# Patient Record
Sex: Female | Born: 1980 | Race: Black or African American | Hispanic: No | Marital: Married | Smoking: Never smoker
Health system: Southern US, Community
[De-identification: ages and names within clinical notes are randomized; demographics above are authoritative.]

## PROBLEM LIST (undated history)

## (undated) ENCOUNTER — Inpatient Hospital Stay (HOSPITAL_COMMUNITY): Payer: Self-pay

## (undated) DIAGNOSIS — K37 Unspecified appendicitis: Secondary | ICD-10-CM

## (undated) DIAGNOSIS — Z87448 Personal history of other diseases of urinary system: Secondary | ICD-10-CM

## (undated) DIAGNOSIS — M94261 Chondromalacia, right knee: Secondary | ICD-10-CM

## (undated) HISTORY — DX: Unspecified appendicitis: K37

---
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
## 1999-08-01 HISTORY — PX: APPENDECTOMY: SHX54

## 2001-11-29 ENCOUNTER — Emergency Department (HOSPITAL_COMMUNITY): Admission: EM | Admit: 2001-11-29 | Discharge: 2001-11-29 | Payer: Self-pay | Admitting: Emergency Medicine

## 2001-12-01 ENCOUNTER — Inpatient Hospital Stay (HOSPITAL_COMMUNITY): Admission: AD | Admit: 2001-12-01 | Discharge: 2001-12-01 | Payer: Self-pay | Admitting: Obstetrics and Gynecology

## 2002-12-09 ENCOUNTER — Inpatient Hospital Stay (HOSPITAL_COMMUNITY): Admission: AD | Admit: 2002-12-09 | Discharge: 2002-12-10 | Payer: Self-pay | Admitting: Obstetrics and Gynecology

## 2003-03-12 ENCOUNTER — Emergency Department (HOSPITAL_COMMUNITY): Admission: EM | Admit: 2003-03-12 | Discharge: 2003-03-12 | Payer: Self-pay | Admitting: Emergency Medicine

## 2003-09-13 ENCOUNTER — Inpatient Hospital Stay (HOSPITAL_COMMUNITY): Admission: AD | Admit: 2003-09-13 | Discharge: 2003-09-14 | Payer: Self-pay | Admitting: *Deleted

## 2004-02-14 ENCOUNTER — Inpatient Hospital Stay (HOSPITAL_COMMUNITY): Admission: AD | Admit: 2004-02-14 | Discharge: 2004-02-14 | Payer: Self-pay | Admitting: Obstetrics and Gynecology

## 2004-03-15 ENCOUNTER — Emergency Department (HOSPITAL_COMMUNITY): Admission: EM | Admit: 2004-03-15 | Discharge: 2004-03-16 | Payer: Self-pay | Admitting: Emergency Medicine

## 2004-09-12 ENCOUNTER — Inpatient Hospital Stay (HOSPITAL_COMMUNITY): Admission: AD | Admit: 2004-09-12 | Discharge: 2004-09-12 | Payer: Self-pay | Admitting: *Deleted

## 2005-04-16 ENCOUNTER — Inpatient Hospital Stay (HOSPITAL_COMMUNITY): Admission: AD | Admit: 2005-04-16 | Discharge: 2005-04-16 | Payer: Self-pay | Admitting: Obstetrics & Gynecology

## 2005-07-08 ENCOUNTER — Emergency Department (HOSPITAL_COMMUNITY): Admission: EM | Admit: 2005-07-08 | Discharge: 2005-07-08 | Payer: Self-pay | Admitting: Emergency Medicine

## 2007-12-24 ENCOUNTER — Encounter: Admission: RE | Admit: 2007-12-24 | Discharge: 2007-12-24 | Payer: Self-pay | Admitting: Obstetrics and Gynecology

## 2008-02-03 ENCOUNTER — Inpatient Hospital Stay (HOSPITAL_COMMUNITY): Admission: AD | Admit: 2008-02-03 | Discharge: 2008-02-03 | Payer: Self-pay | Admitting: Obstetrics and Gynecology

## 2008-02-04 ENCOUNTER — Inpatient Hospital Stay (HOSPITAL_COMMUNITY): Admission: AD | Admit: 2008-02-04 | Discharge: 2008-02-07 | Payer: Self-pay | Admitting: Obstetrics and Gynecology

## 2008-07-28 ENCOUNTER — Encounter: Admission: RE | Admit: 2008-07-28 | Discharge: 2008-07-28 | Payer: Self-pay | Admitting: Obstetrics and Gynecology

## 2008-07-28 IMAGING — CT CT ABDOMEN W/ CM
3 of 4 series · 14 of 32 positions shown, 19 images · IV contrast (30CC OMNI 350 & [ID] OMNI 300)
Comparison: Ultrasound of [DATE]

CLINICAL DATA: Ventral abdominal wall hernia or lipoma.  Superior
to umbilicus.  Nausea.  5 months postpartum.  Prior appendectomy.

CT ABDOMEN  WITH CONTRAST
TECHNIQUE: Multidetector CT imaging of the abdomen was performed
following the standard protocol following the bolus administration
of intravenous contrast.
Contrast: 100 ml [O8]

[Series 2: abdomen w/ · axial · 0.70mm/px · z∈[-219,-39]mm · 4 of 61 slices shown, 9 images]
[im 13/61  soft-tissue]
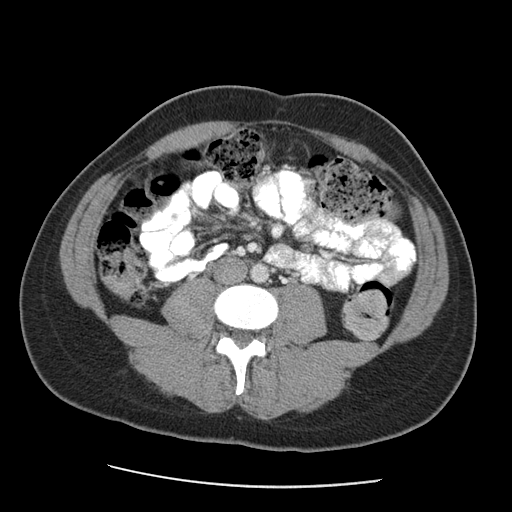
[im 13/61  lung]
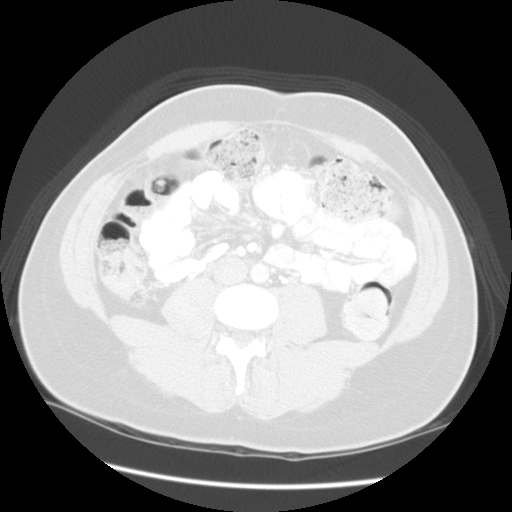
[im 13/61  bone]
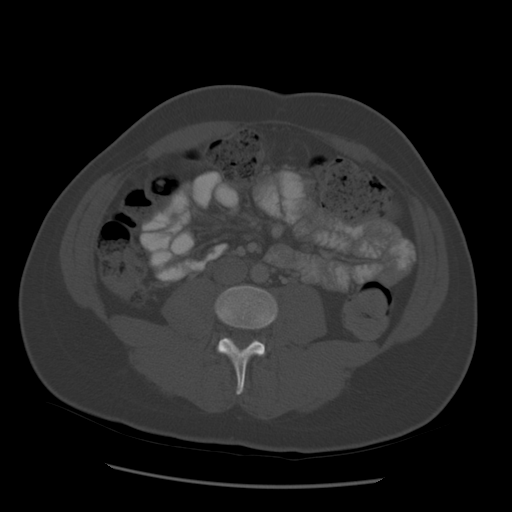
[im 25/61  soft-tissue]
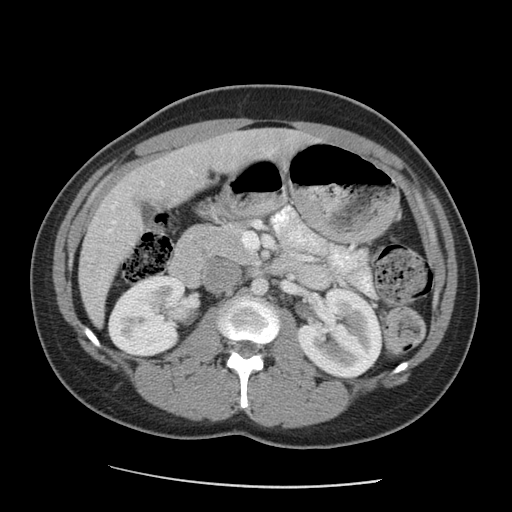
[im 25/61  lung]
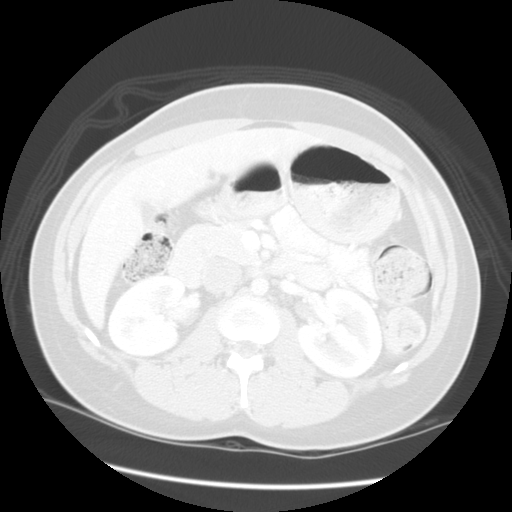
[im 37/61  soft-tissue]
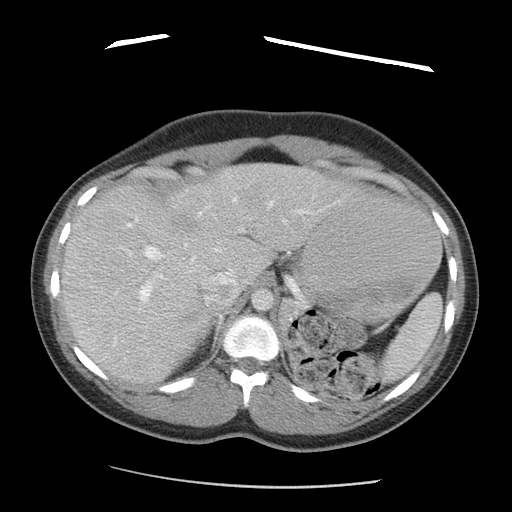
[im 37/61  lung]
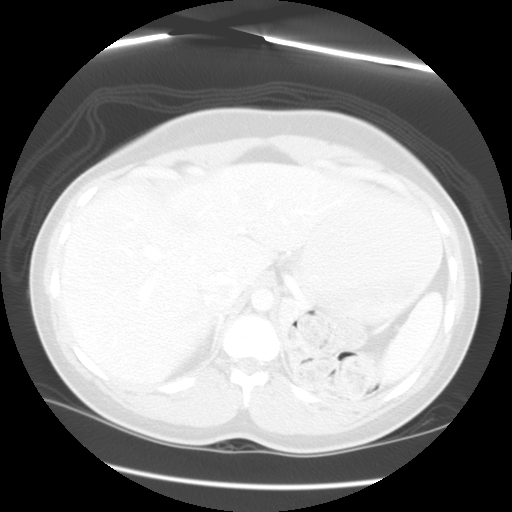
[im 49/61  soft-tissue]
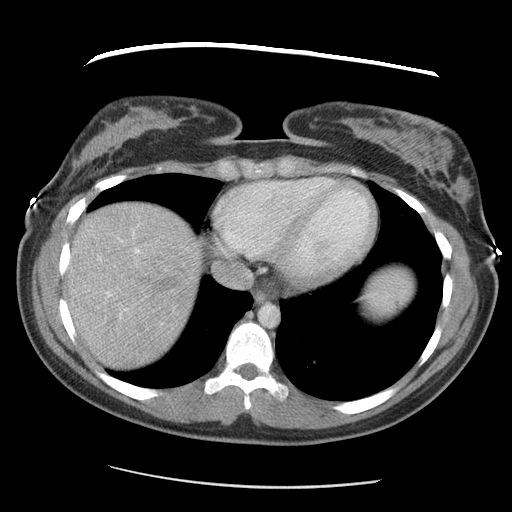
[im 49/61  lung]
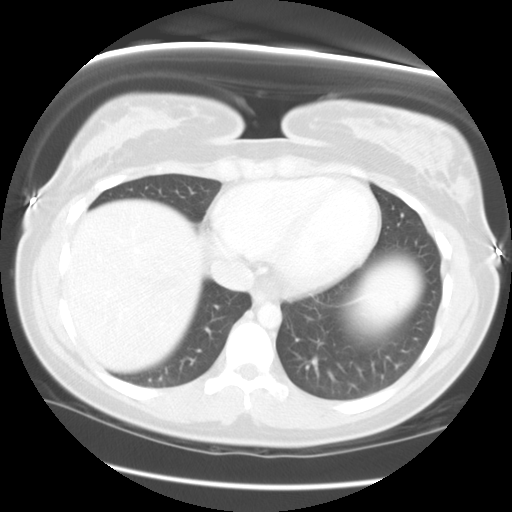

[Series 400: coronal · coronal · 0.70mm/px · 2 of 111 slices shown]
[im 13/111  soft-tissue]
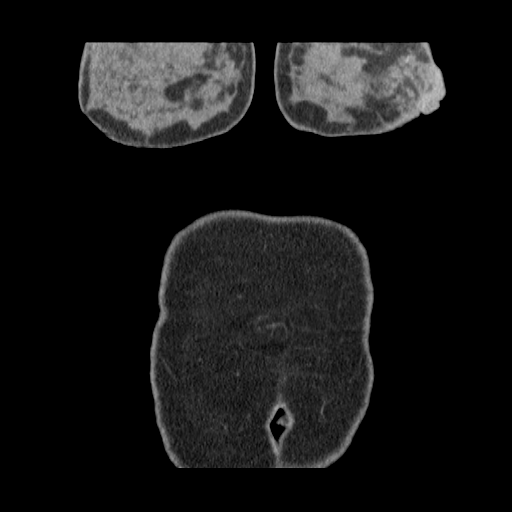
[im 25/111  soft-tissue]
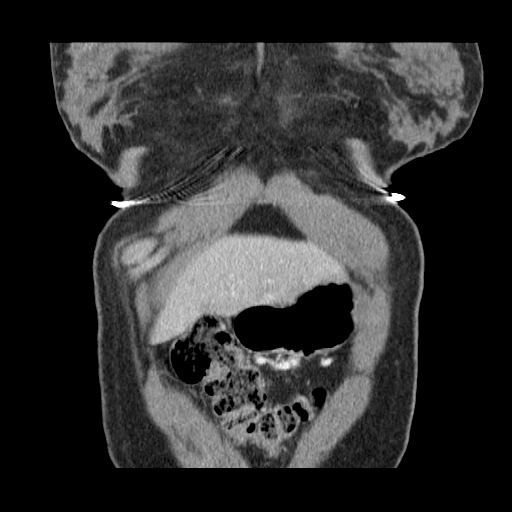

[Series 401: sagittal · sagittal · 0.70mm/px · 8 of 140 slices shown]
[im 12/140  soft-tissue]
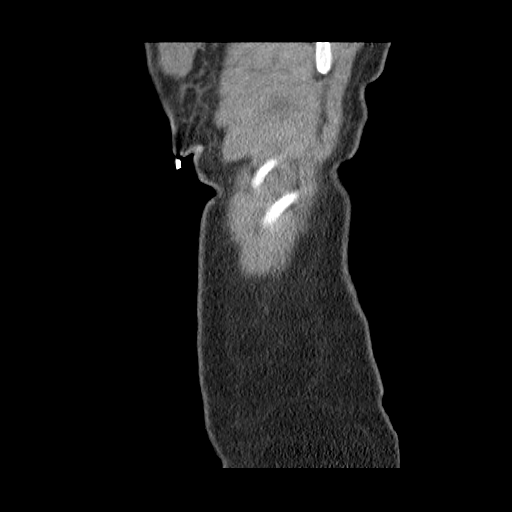
[im 35/140  soft-tissue]
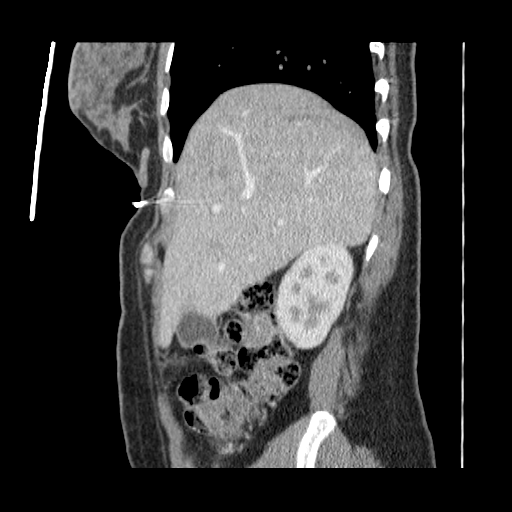
[im 47/140  soft-tissue]
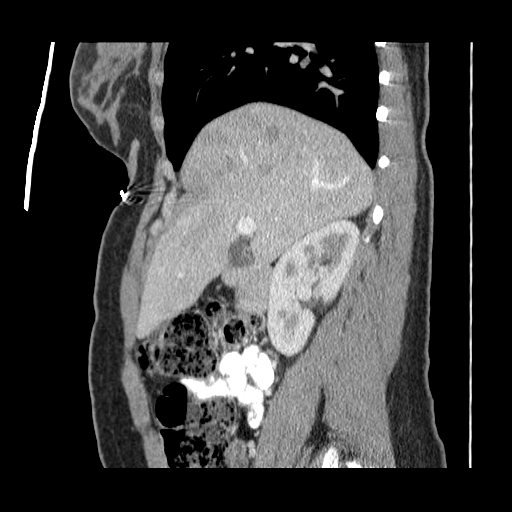
[im 58/140  soft-tissue]
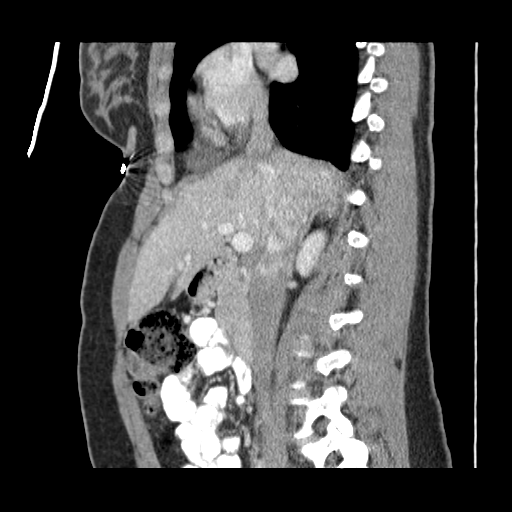
[im 82/140  soft-tissue]
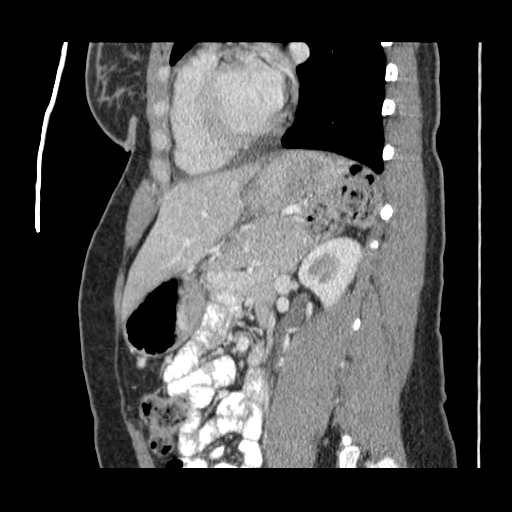
[im 93/140  soft-tissue]
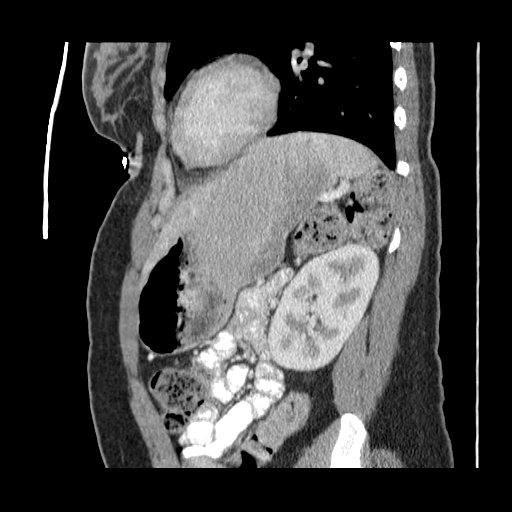
[im 105/140  soft-tissue]
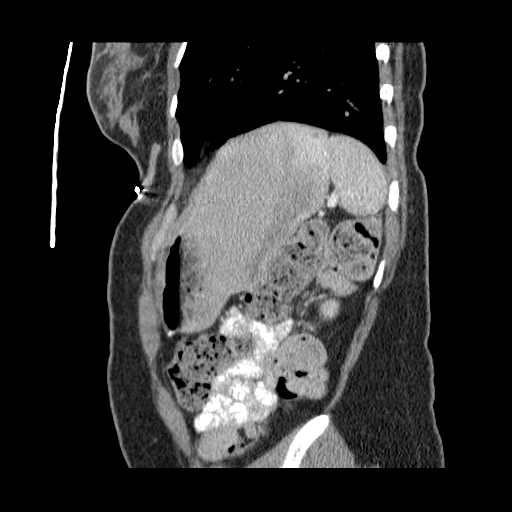
[im 128/140  soft-tissue]
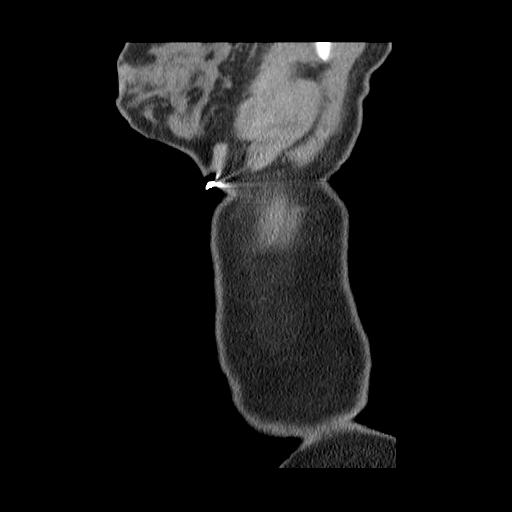

[14 of 32 positions shown; findings below may reference images not displayed]

FINDINGS: Minimal  atelectasis in the lingula.

Normal heart size without pericardial or pleural effusion.  Normal
liver, spleen, stomach, pancreas, gallbladder, biliary tract,
adrenal glands, kidneys. No retroperitoneal or retrocrural
adenopathy. Colonic stool burden suggests constipation.

Likely normal variant posterior position of the splenic flexure
colon. Normal terminal ileum.   A ventral abdominal wall hernia
contains only omental fat on image 41 of series 2.  There is no
radiopaque marker at the site, the but this is presumably the
palpable abnormality.  The hernia neck measures 1.5 cm.

No bowel within. Normal abdominal small bowel without ascites. No
acute osseous abnormality.
IMPRESSION: 1.  Ventral, supraumbilical abdominal wall hernia containing only
omental fat.
2.  Probable constipation without acute abdominal process.

## 2008-08-31 HISTORY — PX: HERNIA REPAIR: SHX51

## 2009-09-24 ENCOUNTER — Inpatient Hospital Stay (HOSPITAL_COMMUNITY): Admission: AD | Admit: 2009-09-24 | Discharge: 2009-09-24 | Payer: Self-pay | Admitting: Obstetrics and Gynecology

## 2009-10-17 ENCOUNTER — Emergency Department (HOSPITAL_COMMUNITY): Admission: EM | Admit: 2009-10-17 | Discharge: 2009-10-18 | Payer: Self-pay | Admitting: Emergency Medicine

## 2010-06-18 LAB — URINE CULTURE

## 2010-06-18 LAB — URINALYSIS, ROUTINE W REFLEX MICROSCOPIC
Bilirubin Urine: NEGATIVE
Glucose, UA: NEGATIVE mg/dL
Ketones, ur: NEGATIVE mg/dL
Nitrite: NEGATIVE
Protein, ur: NEGATIVE mg/dL
Specific Gravity, Urine: <1.005 — ABNORMAL LOW (ref 1.005–1.030)
Urobilinogen, UA: 0.2 mg/dL (ref 0.0–1.0)
pH: 6.5 (ref 5.0–8.0)

## 2010-06-18 LAB — URINE MICROSCOPIC-ADD ON

## 2010-06-18 LAB — POCT PREGNANCY, URINE: Preg Test, Ur: NEGATIVE

## 2010-08-15 NOTE — H&P (Signed)
NAMEDIONE, PETRON            ACCOUNT NO.:  [id number]   MEDICAL RECORD NO.:  [id number]          PATIENT TYPE:  INP   LOCATION:  9174                          FACILITY:  [facility]   PHYSICIAN:  Crist Fat. Rivard, M.D. DATE OF BIRTH:  1980/09/07   DATE OF ADMISSION:  02/04/2008  DATE OF DISCHARGE:                              HISTORY & PHYSICAL   HISTORY OF PRESENT ILLNESS:  Kenishia is a 30 year old gravida 3, para 0,  at 34 weeks' gestation who presents to the MAU with strong contractions  every 5-6 minutes and possible rupture of membranes around 12:30 this  afternoon.  She presents around 6:30 p.m.  She was seen yesterday in the  MAU and was sent home with Ambien.  Her cervix yesterday was 4 cm.  Ms.  Lisle pregnancy is remarkable for:  1.  Sickle cell trait; 2.  First trimester spotting; 3.  Gestational diabetes requiring insulin  management; 4.  DV; 5.  Urinary tract infection with three or four  courses of Macrobid; 6.  Positive group B Strep; 7.  An umbilical hernia  on the patient.   LABORATORY DATA:  Prenatal labs:  On April 23, her hemoglobin was 11.2,  hematocrit 34.3, platelets 391.  Her blood type is A positive.  She had  a negative antibody screen.  Her RPR was nonreactive.  Rubella titer  immune.  Hepatitis B surface antigen negative.  HIV nonreactive.  She  had a Pap smear done in June with her primary doctor.  We do not have a  copy of that, but that was June of 2008.  The patient declined first  trimester screen and alpha theta protein.   HISTORY OF PRESENT PREGNANCY:  Ms. Nelon entered care with [facility] at around 10 weeks' gestation.  She had an ultrasound at  that time that confirmed an estimated date of confinement on February 18, 2008, which corresponded to the [facility] calculated on her last menstrual  period of February 10.  In early pregnancy she was treated with  Phenergan and Zofran for nausea and vomiting as well as Vicodin for  headaches.  At 17 weeks she had an ultrasound for size greater than  dates which showed a cervix of 3.61 cm and anterior placenta and  incomplete anatomy of the heart was visualized.  Another ultrasound was  done at 23 weeks, where complete anatomy of the heart was visualized and  found to be normal and the rest of the ultrasound was normal as well,  including the fluid level.  At 28 weeks she failed her 1-hour Glucola  with a number of 177 mg/dl.  Following that she developed the stomach  flu and kind of delayed her 3-hour glucose test which she finally took  and failed, giving her a diagnosis of gestational diabetes, causing her  to be transferred from the midwifery service to the M.D. service.  At 31  weeks she had another ultrasound and had an AFI of 20 which was in the  80th percent but normal growth and all other findings normal.  She had  repeated prescriptions  for Macrobid for leukocytes and blood in her  urine three or four different times throughout the course of the  pregnancy from the beginning to the end.  A final ultrasound was done  two days ago in the office at 37-5/7 weeks showing an estimated fetal  weight of 7 pounds 12 ounces and amniotic fluid index of 13.5, and a BPP  of 8.   OBSTETRICAL HISTORY:  Ms. Morency first pregnancy ended in 2004 at  32 weeks' gestation with a spontaneous abortion.  No complications.  Second pregnancy ended in 2006 with a therapeutic abortion at 54 weeks'  gestation with no complications.   PAST MEDICAL HISTORY:  1. She has used Depo-Provera and oral contraceptive pills in the past.  2. She occasionally has infections of bacterial vaginosis or yeast in      her vagina.  3. She is positive for sickle trait.  4. Her last Pap was in June of 2008 with her primary M.D., not on      record at the hospital with her prenatal records.  5. She had a history of pyelonephritis in 2000.  6. In 1999 she had a broken toe.   PAST SURGICAL HISTORY:   Ms. Miranda had an appendectomy in 2002.   ALLERGIES:  She has no known drug allergies.   FAMILY HISTORY:  Her mother and maternal grandmother have hypertension.  Her maternal aunt has asthma.  Her maternal uncle is a type 2 diabetic.  Her maternal aunt and first cousin are on dialysis.  Maternal uncle,  maternal grandmother suffered CVA.  First cousin, maternal aunt have  lupus.  Multiple aunts and uncles on both sides of the family and her  brother have an unstated addiction disorder.  Her sister, dad, and  paternal aunt have polydactyly.   GENETIC HISTORY:  Positive for sickle trait.   SOCIAL HISTORY:  Ms. Heiler is married to the father of the baby who  is involved and supportive.  They are [place] and of the  [place] faith.  Her husband's name is Kennie Karapetian.  Orvella has a  bachelor's degree and works as a Associate Professor.  Her husband is a Information systems manager.  She denies any use of alcohol, tobacco, or street drugs.   PHYSICAL EXAMINATION:  VITAL SIGNS:  Stable, afebrile.  HEENT:  Within normal limits.  LUNGS:  Clear to auscultation bilaterally.  HEART:  Regular rate and rhythm.  No murmurs.  BREASTS:  Soft, nontender.  ABDOMEN:  Gravid.  Umbilical hernia noted.  Estimated fetal weight 7-1/2  to 8 pounds.  Contractions 5-6 minutes apart, lasting 60-100 seconds,  strong, soft uterine resting time.  Fetal heart rate baseline 135 beats  per minute, moderate variability present, reactive.  Accelerations to  150, very active fetus.  Negative spontaneous contraction stress test.  EXTREMITIES:  No edema.  DTRs +1/+1.  GENITALIA:  Vaginal exam 5-6 cm dilated, 100% effaced, -1 station,  bulging bag of water, positive cooling, negative fern test.   IMPRESSION:  1. Intrauterine pregnancy at 38 weeks.  2. Insulin dependent gestational diabetes.  3. Positive group B Strep.  4. Active labor.   PLAN:  1. Admit to L and D under M.D. management per consult with Dr.  Estanislado Pandy.  2. Treatment of GBS with penicillin G per protocol.  3. Capillary  blood glucose q.2 h.  4. Stadol 1 mg with Phenergan 12.5 mg IV push x1 for pain followed by      epidural  per patient request.  5. Routine physician orders.      Janna Melsness, CNM      ______________________________  Crist Fat Rivard, M.D.    JM/MEDQ  D:  02/04/2008  T:  02/05/2008  Job:  [id number]

## 2010-08-21 ENCOUNTER — Inpatient Hospital Stay (HOSPITAL_COMMUNITY)
Admission: AD | Admit: 2010-08-21 | Discharge: 2010-08-22 | Disposition: A | Discharge status: Home / Self Care | Payer: 59 | Source: Ambulatory Visit | Attending: Obstetrics and Gynecology | Admitting: Obstetrics and Gynecology

## 2010-08-21 DIAGNOSIS — N390 Urinary tract infection, site not specified: Secondary | ICD-10-CM | POA: Insufficient documentation

## 2010-08-21 DIAGNOSIS — R110 Nausea: Secondary | ICD-10-CM | POA: Insufficient documentation

## 2010-08-21 LAB — URINALYSIS, ROUTINE W REFLEX MICROSCOPIC
Glucose, UA: 100 mg/dL — AB
Protein, ur: NEGATIVE mg/dL
Urobilinogen, UA: 2 mg/dL — ABNORMAL HIGH (ref 0.0–1.0)

## 2010-08-21 LAB — URINE MICROSCOPIC-ADD ON

## 2010-08-24 LAB — URINE CULTURE
Colony Count: 55000
Culture  Setup Time: 201205220452

## 2010-12-07 LAB — ANTIBODY SCREEN: Antibody Screen: NEGATIVE

## 2010-12-07 LAB — RUBELLA ANTIBODY, IGM: Rubella: IMMUNE

## 2010-12-07 LAB — HIV ANTIBODY (ROUTINE TESTING W REFLEX): HIV: NONREACTIVE

## 2010-12-20 ENCOUNTER — Ambulatory Visit (INDEPENDENT_AMBULATORY_CARE_PROVIDER_SITE_OTHER): Payer: 59 | Admitting: Surgery

## 2010-12-20 ENCOUNTER — Encounter (INDEPENDENT_AMBULATORY_CARE_PROVIDER_SITE_OTHER): Payer: Self-pay | Admitting: Surgery

## 2010-12-20 VITALS — BP 112/62 | HR 76 | Temp 97.1°F | Resp 16 | Ht 66.0 in | Wt 161.4 lb

## 2010-12-20 DIAGNOSIS — R109 Unspecified abdominal pain: Secondary | ICD-10-CM | POA: Insufficient documentation

## 2010-12-20 NOTE — Progress Notes (Signed)
Subjective:     Patient ID: Jessica [name], female   DOB: Jan 07, 1981, 30 y.o.   MRN: [id number]  HPI  Reason for visit: Left-sided abdominal pain question of hernia recurrence.  Patient is a healthy young female whom I did a laparoscopic supra & periumbilical ventral hernia repairs with 10x15cm mesh in July 2010. She recovered from that well. She is now again pregnant, 10 weeks.  She noted last month she started getting some pulling discomfort in her left abdomen. Not related to any event that she can recall such as a fall or lifting. It has gradually worsened to a chronic ache instead of just intermittent. She has been taking Tylenol for pain control. She's been using heat as well. That helps somewhat but is not adequate. Because she is in her first trimester, she has avoided any nonsteroidals. She was sent to me to see if she had evidence of hernia recurrence or any other interventions need to be done.  The patient has a little mild nausea in the morning consistent with her first trimester which she's had with her 1st pregnancy the past. No severe nausea vomiting. Daily bowel movements. No bad constipation. The pain is worse when she tries to stand up or she coughs or sneezes. Not related to eating. No heartburn or reflux.  Review of Systems  Constitutional: Negative for fever, chills, diaphoresis, appetite change and fatigue.  HENT: Negative for ear pain, sore throat, trouble swallowing, neck pain and ear discharge.   Eyes: Negative for photophobia, discharge and visual disturbance.  Respiratory: Negative for cough, choking, chest tightness and shortness of breath.   Cardiovascular: Negative for chest pain and palpitations.  Gastrointestinal: Positive for nausea. Negative for vomiting, diarrhea, constipation, blood in stool, abdominal distention, anal bleeding and rectal pain.  Genitourinary: Negative for dysuria, frequency and difficulty urinating.  Musculoskeletal: Negative for myalgias  and gait problem.  Skin: Negative for color change, pallor and rash.  Neurological: Negative for dizziness, speech difficulty, weakness and numbness.  Hematological: Negative for adenopathy.  Psychiatric/Behavioral: Negative for confusion and agitation. The patient is not nervous/anxious.        Objective:   Physical Exam  Constitutional: She is oriented to person, place, and time. She appears well-developed and well-nourished. No distress.  HENT:  Head: Normocephalic.  Mouth/Throat: Oropharynx is clear and moist. No oropharyngeal exudate.  Eyes: Conjunctivae and EOM are normal. Pupils are equal, round, and reactive to light. No scleral icterus.  Neck: Normal range of motion. Neck supple. No tracheal deviation present.  Cardiovascular: Normal rate, regular rhythm and intact distal pulses.   Pulmonary/Chest: Effort normal and breath sounds normal. No respiratory distress. She exhibits no tenderness.  Abdominal: Soft. She exhibits no distension and no mass. There is no rebound and no guarding. Hernia confirmed negative in the right inguinal area and confirmed negative in the left inguinal area.       Mild TTP left abdomen, lateral edge of rectus, 8cm lateral to umbilicus, esp periumbilical.  No hernias.  Mild supraumbilical diastasis.  Pathophysiology of muscle strain and nerve pain was discussed.  Options were discussed including the use of ice/heat/OTC pain medications.  Injection of local anesthetic to control the pain was discussed.  Risks/benefits/alternatives were discussed.  Questions were answered.  The patient agreed to proceed.  I sterilely injected a field block of local aesthetic (Lidocaine & bupivicaine x 60mL) into the left paramedian abdominal wall region of pain.  The patient tolerated the procedure well.  Her pain was  markedly decreased.  Genitourinary: No vaginal discharge found.  Musculoskeletal: Normal range of motion. She exhibits no tenderness.  Lymphadenopathy:    She  has no cervical adenopathy.       Right: No inguinal adenopathy present.       Left: No inguinal adenopathy present.  Neurological: She is alert and oriented to person, place, and time. No cranial nerve deficit. She exhibits normal muscle tone. Coordination normal.  Skin: Skin is warm and dry. No rash noted. She is not diaphoretic. No erythema.  Psychiatric: She has a normal mood and affect. Her behavior is normal. Judgment and thought content normal.       Assessment:     Musculoskeletal strain on the left abdominal wall. Probable pulling at stitch at the lateral edge of the mesh. Improved after injection. No evidence of hernia recurrence or new hernia.    Plan:     Increase Tylenol to 1 g p.o. q.i.d. Increased heating pad to , 6 times a day. I did offer a course of narcotics if it's okay with her obstetrician. She wants to avoid that.  She may benefit from another injection or so. I am hopeful this is all she needs. I worry that this could get worse as her abdomen stretches out as the pregnancy goes along. However, I feel reassured that there is no evidence of any her recurrent hernia nor major pathology. I would not do any more aggressive workup at this time unless something changes. She & her husband expressed understanding & appreciation.  Follow up PRN.  I gave them my card to card if things worsen or do not improve.

## 2010-12-20 NOTE — Patient Instructions (Signed)
Managing Pain  Pain after surgery or related to activity is often due to strain/injury to muscle, tendon, nerves and/or incisions.  This pain is usually short-term and will improve in a few months.   Many people find it helpful to do the following things TOGETHER to help speed the process of healing and to get back to regular activity more quickly:  1. Avoid heavy physical activity a. No lifting greater than 20 pounds b. Do not "push through" the pain.  Listen to your body and avoid positions and maneuvers than reproduce the pain c. Walking is okay as tolerated, but go slowly and stop when getting sore.  d. Remember: If it hurts to do it, then don't do it! 2. Take Anti-inflammatory medication  a. Take with food/snack around the clock for 1-2 weeks i. This helps the muscle and nerve tissues become less irritable and calm down faster b. Choose ONE of the following over-the-counter medications: i. Acetaminophen 500mg  tabs (Tylenol) 2 pills with every meal and just before bedtime (= 4 times a day) 3. Use a Heating pad or Ice/Cold Pack a. 6 times a day b. May use warm bath/hottub  or showers 4. Try Gentle Massage and/or Stretching  a. at the area of pain many times a day b. stop if you feel pain - do not overdo it  Try these steps together to help you body heal faster and avoid making things get worse.  Doing just one of these things may not be enough.    If you are not getting better after two weeks or are noticing you are getting worse, contact our office for further advice; we may need to re-evaluate you & see what other things we can do to help.

## 2010-12-29 ENCOUNTER — Telehealth (INDEPENDENT_AMBULATORY_CARE_PROVIDER_SITE_OTHER): Payer: Self-pay

## 2010-12-29 MED ORDER — HYDROCODONE-ACETAMINOPHEN 5-500 MG PO TABS: 1.0000 | ORAL_TABLET | ORAL | Status: DC | PRN

## 2010-12-29 NOTE — Telephone Encounter (Signed)
Deanna Artis returned Germaine's phone message from yesterday about the pt taking pain med. B/c she is [redacted]wks pregnant. Deanna Artis said she asked their doc on call about this pt and they ok'd her to take Vicodin and Motrin. I will call the pt to notify her of the info.Hulda Humphrey

## 2010-12-29 NOTE — Telephone Encounter (Signed)
Called pt to notify her that we did speak with Dr Debria Garret practice about her taking pain medicine. I advised her that we would call in Vicodin 5/500 #40 to [facility]. And that her doctor's office said she could also take Motrin 600mg  q 6hrs up until she is [redacted]wks pregnant. The pt understands the info.Hulda Humphrey

## 2011-01-01 ENCOUNTER — Ambulatory Visit (INDEPENDENT_AMBULATORY_CARE_PROVIDER_SITE_OTHER): Payer: 59 | Admitting: Surgery

## 2011-01-02 LAB — GLUCOSE, CAPILLARY
Glucose-Capillary: 105 — ABNORMAL HIGH
Glucose-Capillary: 85
Glucose-Capillary: 88
Glucose-Capillary: 91
Glucose-Capillary: 95

## 2011-01-02 LAB — CBC
HCT: 28.8 — ABNORMAL LOW
MCHC: 31.9
Platelets: 257
RDW: 14.2
RDW: 14.5

## 2011-01-02 LAB — RPR: RPR Ser Ql: NONREACTIVE

## 2011-01-04 ENCOUNTER — Telehealth (INDEPENDENT_AMBULATORY_CARE_PROVIDER_SITE_OTHER): Payer: Self-pay

## 2011-01-04 ENCOUNTER — Telehealth (INDEPENDENT_AMBULATORY_CARE_PROVIDER_SITE_OTHER): Payer: Self-pay | Admitting: General Surgery

## 2011-01-04 ENCOUNTER — Other Ambulatory Visit (INDEPENDENT_AMBULATORY_CARE_PROVIDER_SITE_OTHER): Payer: Self-pay | Admitting: Surgery

## 2011-01-04 MED ORDER — HYDROCODONE-ACETAMINOPHEN 5-500 MG PO TABS: 40.0000 | ORAL_TABLET | ORAL | Status: DC | PRN

## 2011-01-04 NOTE — Telephone Encounter (Signed)
Patient called stating she is still having tremendous pain, contacted her OB and they referred her back to [place]. The vicodin is not taking her pain away and is concerned about being on pain meds during her pregnancy. She would like to know what could be done at this point to control her pain.

## 2011-01-04 NOTE — Telephone Encounter (Signed)
Called pt to let her know Dr. Michaell Cowing would refill her Vicodin 5/325 #40.  I called Rx in to [facility] [address].  Pt notified.

## 2011-01-15 ENCOUNTER — Ambulatory Visit (INDEPENDENT_AMBULATORY_CARE_PROVIDER_SITE_OTHER): Payer: 59 | Admitting: Surgery

## 2011-01-15 ENCOUNTER — Encounter (INDEPENDENT_AMBULATORY_CARE_PROVIDER_SITE_OTHER): Payer: Self-pay | Admitting: Surgery

## 2011-01-15 VITALS — BP 118/68 | HR 72 | Temp 97.6°F | Resp 20 | Ht 65.0 in | Wt 161.5 lb

## 2011-01-15 DIAGNOSIS — R109 Unspecified abdominal pain: Secondary | ICD-10-CM

## 2011-01-15 MED ORDER — OXYCODONE-ACETAMINOPHEN 10-650 MG PO TABS: 1.0000 | ORAL_TABLET | Freq: Four times a day (QID) | ORAL | Status: DC | PRN

## 2011-01-15 NOTE — Progress Notes (Signed)
Subjective:     Patient ID: Jessica [name], female   DOB: Feb 06, 1981, 30 y.o.   MRN: [id number]  HPI  Patient Care Team: Janine Limbo, MD as Consulting Physician (Obstetrics and Gynecology)  This patient is a 30 y.o.female who presents today for surgical evaluation.   Reason for visit: Followup on L paramedian abdominal pain s/p Lap VWH repair June 2010  Patient notes she still having pain in the left paramedian area just lateral to her bellybutton.  Worse with cough & sneeze.  She tried using Vicodin but it did not seem strong enough. She had itching and now has stopped. She has not tried any nonsteroidals given her pregnancy. She recalls being told she cannot start until her second trimester. Our records note that it was okay to give ibuprofen until 28 weeks. She notes heat helps best. The abdominal binder made it more comfortable.  She did get 8 hours of relief with the injection and that the whole area was numb. However she felt the pain came right back. She does not want another injection. No nausea or vomiting beyond a little mild morning sickness. No fevers/chills/sweats. She has having a bowel movement about every other day. He comes today with a friend.  Past Medical History  Diagnosis Date  . Diabetes mellitus     gestational  . Anemia   . Abdominal pain   . Hernia     Past Surgical History  Procedure Date  . Appendectomy 08/1999  . Hernia repair 08/2008    lap supraumb VWH    History   Social History  . Marital Status: Married    Spouse Name: N/A    Number of Children: N/A  . Years of Education: N/A   Occupational History  . Not on file.   Social History Main Topics  . Smoking status: Never Smoker   . Smokeless tobacco: Not on file  . Alcohol Use: No  . Drug Use: No  . Sexually Active: Not on file   Other Topics Concern  . Not on file   Social History Narrative  . No narrative on file    History reviewed. No pertinent family history.  Current  outpatient prescriptions:Infant Foods (ENFAMIL/IRON PO), Take by mouth daily.  , Disp: , Rfl: ;  oxyCODONE-acetaminophen (PERCOCET) 10-650 MG per tablet, Take 1-2 tablets by mouth every 6 (six) hours as needed for pain., Disp: 50 tablet, Rfl: 0  Allergies  Allergen Reactions  . Vicodin (Hydrocodone-Acetaminophen)     Hives        Review of Systems  Constitutional: Negative for fever, chills and diaphoresis.  HENT: Negative for ear pain, sore throat and trouble swallowing.   Eyes: Negative for photophobia and visual disturbance.  Respiratory: Negative for cough and choking.   Cardiovascular: Negative for chest pain and palpitations.  Gastrointestinal: Positive for constipation. Negative for nausea, vomiting, diarrhea, blood in stool, abdominal distention, anal bleeding and rectal pain.  Genitourinary: Negative for dysuria, urgency, frequency, decreased urine volume and difficulty urinating.  Musculoskeletal: Negative for myalgias, back pain, joint swelling, arthralgias and gait problem.  Skin: Negative for color change, pallor, rash and wound.  Neurological: Negative for dizziness, speech difficulty, weakness and numbness.  Hematological: Negative for adenopathy.  Psychiatric/Behavioral: Negative for confusion and agitation. The patient is not nervous/anxious.        Objective:   Physical Exam  Constitutional: She is oriented to person, place, and time. She appears well-developed and well-nourished. No distress.  HENT:  Head: Normocephalic.  Mouth/Throat: Oropharynx is clear and moist. No oropharyngeal exudate.  Eyes: Conjunctivae and EOM are normal. Pupils are equal, round, and reactive to light. No scleral icterus.  Neck: Normal range of motion. No tracheal deviation present.  Cardiovascular: Normal rate and intact distal pulses.   Pulmonary/Chest: Effort normal. No respiratory distress. She exhibits no tenderness.  Abdominal: Soft. Bowel sounds are normal. She exhibits no  distension and no mass. There is no rebound and no guarding. Hernia confirmed negative in the right inguinal area and confirmed negative in the left inguinal area.       Incisions clean with normal healing ridges.  No hernias.  Mild TTP L paramedian, ~7cm lateral to umbilicus.  No fluctuance, abscess, cellulitis  Genitourinary: No vaginal discharge found.  Musculoskeletal: Normal range of motion. She exhibits no tenderness.  Lymphadenopathy:       Right: No inguinal adenopathy present.       Left: No inguinal adenopathy present.  Neurological: She is alert and oriented to person, place, and time. No cranial nerve deficit. She exhibits normal muscle tone. Coordination normal.  Skin: Skin is warm and dry. No rash noted. She is not diaphoretic.  Psychiatric: She has a normal mood and affect. Her behavior is normal.       Assessment:     Abdominal wall pain left paramedian region. Musculoskeletal.    Plan:     Unfortunately this is a challenging situation. With her pregnancy, she started having increasing abdominal straining to that area and probably is going to continue to irritate him. It may worsen. Most muscle portals will improve over a few months with the help of nonsteroidals and heat time.  I discussed with the my partners.  I think an injection with a steroid this time could be helpful, often in a series of weekly x2-3. However, she has no interest in that today. I offered that she switch to a different narcotic and try oxycodone. She is open to that.  Perhaps amitriptyline would help but I would need to check with her obstetrician.  Continue tylenol regularly until it is safe to try an antiinflammatory such as ibuprofen.  Treat her constipation to keep the abdominal pressure down.  It is too soon from her point of complaint to really be more aggressive. I do not think surgery to remove a stitch would help. It would also put pregnancy at risk.  There is no evidence of infection or bowel  obstruction or other issues that would explain this.  I do not think any study since an MRI would be of much utility at this time.  I called Dr. Stefano Gaul. He did note that it is probably safe to try a chronic pain medicine such as amitriptyline. He said he would call that to offer it to her. He was hopeful that she may  reconsider an injection as an option.  F/U PRN for now, but offer RTC visit after Dr. Stefano Gaul talks to her.

## 2011-01-15 NOTE — Patient Instructions (Addendum)
Managing Pain  Pain after surgery or related to activity is often due to strain/injury to muscle, tendon, nerves and/or incisions.  This pain is usually short-term and will improve in a few months.   Many people find it helpful to do the following things TOGETHER to help speed the process of healing and to get back to regular activity more quickly:  1. Avoid heavy physical activity a.  no lifting greater than 20 pounds b. Do not "push through" the pain.  Listen to your body and avoid positions and maneuvers than reproduce the pain c. Walking is okay as tolerated, but go slowly and stop when getting sore.  d. Remember: If it hurts to do it, then don't do it! 2. Take Anti-inflammatory medication  a. Take with food/snack around the clock for 1-2 weeks i. This helps the muscle and nerve tissues become less irritable and calm down faster b. Choose ONE of the following over-the-counter medications: i. Acetaminophen 500mg  tabs (Tylenol) 1-2 pills with every meal and just before bedtime 3. Use a Heating pad or Ice/Cold Pack a. 4-6 times a day b. May use warm bath/hottub  or showers 4. Try Gentle Massage and/or Stretching  a. at the area of pain many times a day b. stop if you feel pain - do not overdo it  Try these steps together to help you body heal faster and avoid making things get worse.  Doing just one of these things may not be enough.    If you are not getting better after two weeks or are noticing you are getting worse, contact our office for further advice; we may need to re-evaluate you & see what other things we can do to help.

## 2011-01-17 ENCOUNTER — Telehealth (INDEPENDENT_AMBULATORY_CARE_PROVIDER_SITE_OTHER): Payer: Self-pay

## 2011-01-17 NOTE — Telephone Encounter (Signed)
Left message for Dr Debria Garret nurse to call me back about the pt having problems with taking the Oxycodone that was prescribed for her on Monday by Dr Michaell Cowing. The pt is c/o nausea and vomiting. Per Dr Michaell Cowing to switch Rx to Tramadol 50mg  #40 take 1-2 q 6hrs x2 RF's. Just need Dr Debria Garret blessing on the drug change.Hulda Humphrey

## 2011-01-17 NOTE — Telephone Encounter (Signed)
Dr Debria Garret nurse called to say Dr Stefano Gaul is out of the office today but will be back tomorrow. They will run the message by Dr Bing Plume and call me back with an answer. Dr Debria Garret nurse needed to call the pt about other stuff so she said she would tell the pt what was going on.Jessica [name]

## 2011-01-19 ENCOUNTER — Telehealth (INDEPENDENT_AMBULATORY_CARE_PROVIDER_SITE_OTHER): Payer: Self-pay

## 2011-01-19 DIAGNOSIS — R109 Unspecified abdominal pain: Secondary | ICD-10-CM

## 2011-01-19 MED ORDER — TRAMADOL HCL 50 MG PO TABS: 50.0000 mg | ORAL_TABLET | Freq: Four times a day (QID) | ORAL | Status: DC | PRN

## 2011-01-19 NOTE — Telephone Encounter (Signed)
Called pt to notify her that I did receive a call back from Dr Debria Garret office about the pt taking Tramadol. Dr Debria Garret office said the pt could take the Rx short term until she comes back to see them on 02-12-11. Dr Stefano Gaul spoke to the pharmacist and the midwife about the Rx b/c they all agree they would be afraid if kept on the Rx long term the baby would have withdrawls from the Tramadol. The pt understands the info. I will call this to [facility] [address]

## 2011-02-20 ENCOUNTER — Encounter (HOSPITAL_COMMUNITY): Payer: Self-pay

## 2011-02-20 ENCOUNTER — Inpatient Hospital Stay (HOSPITAL_COMMUNITY): Payer: 59

## 2011-02-20 ENCOUNTER — Inpatient Hospital Stay (HOSPITAL_COMMUNITY)
Admission: AD | Admit: 2011-02-20 | Discharge: 2011-02-20 | Disposition: A | Discharge status: Home / Self Care | Payer: 59 | Source: Ambulatory Visit | Attending: Obstetrics and Gynecology | Admitting: Obstetrics and Gynecology

## 2011-02-20 DIAGNOSIS — O30049 Twin pregnancy, dichorionic/diamniotic, unspecified trimester: Secondary | ICD-10-CM

## 2011-02-20 DIAGNOSIS — R109 Unspecified abdominal pain: Secondary | ICD-10-CM | POA: Insufficient documentation

## 2011-02-20 DIAGNOSIS — O30009 Twin pregnancy, unspecified number of placenta and unspecified number of amniotic sacs, unspecified trimester: Secondary | ICD-10-CM | POA: Insufficient documentation

## 2011-02-20 DIAGNOSIS — R319 Hematuria, unspecified: Secondary | ICD-10-CM | POA: Insufficient documentation

## 2011-02-20 LAB — URINE MICROSCOPIC-ADD ON

## 2011-02-20 LAB — URINALYSIS, ROUTINE W REFLEX MICROSCOPIC
Bilirubin Urine: NEGATIVE
Ketones, ur: NEGATIVE mg/dL
Nitrite: POSITIVE — AB
Protein, ur: NEGATIVE mg/dL

## 2011-02-20 LAB — WET PREP, GENITAL
Trich, Wet Prep: NONE SEEN
Yeast Wet Prep HPF POC: NONE SEEN

## 2011-02-20 MED ORDER — NITROFURANTOIN MONOHYD MACRO 100 MG PO CAPS: 100.0000 mg | ORAL_CAPSULE | Freq: Two times a day (BID) | ORAL | Status: DC

## 2011-02-20 MED ORDER — NITROFURANTOIN MONOHYD MACRO 100 MG PO CAPS
100.0000 mg | ORAL_CAPSULE | Freq: Two times a day (BID) | ORAL | Status: DC
  Administered 2011-02-20: 100 mg via ORAL
  Filled 2011-02-20: qty 1

## 2011-02-20 NOTE — ED Provider Notes (Signed)
History    29 yo G3P1011 at 19 weeks with twins presented c/o single episode of blood streaming out when she voided.  Denies back pain or any pain other than chronic pain from an umbilical hernia (present since early pregnancy).  No previous history of kidney stones, dysuria, frequency, and no recurrence of event with subsequent voids.   A+ type.  Pregnancy remarkable for: Twin gestation Hx gestational diabetes in previous pregnancy Hx pyelonephritis in past FH polydactly Hx pp depression Hx umbilical hernia repair with mesh 2010  Chief Complaint  Patient presents with  . Hematuria     OB History    Grav Para Term Preterm Abortions TAB SAB Ect Mult Living   3 1 1  0 1 0 1 0 0 1      Past Medical History  Diagnosis Date  . Anemia   . Abdominal pain   . Hernia   . Pyelonephritis     Past Surgical History  Procedure Date  . Appendectomy 08/1999  . Hernia repair 08/2008    lap supraumb VWH    Family History  Problem Relation Age of Onset  . Hypertension Mother     History  Substance Use Topics  . Smoking status: Never Smoker   . Smokeless tobacco: Never Used  . Alcohol Use: No    Allergies:  Allergies  Allergen Reactions  . Vicodin (Hydrocodone-Acetaminophen)     Hives     Prescriptions prior to admission  Medication Sig Dispense Refill  . calcium carbonate (TUMS - DOSED IN MG ELEMENTAL CALCIUM) 500 MG chewable tablet Chew 2-3 tablets by mouth daily as needed. Heartburn        . ibuprofen (ADVIL,MOTRIN) 600 MG tablet Take 600 mg by mouth every 6 (six) hours as needed. Pain        . prenatal vitamin w/FE, FA (PRENATAL 1 + 1) 27-1 MG TABS Take 2 tablets by mouth daily.           Physical Exam   Blood pressure 134/75, pulse 89, temperature 98.8 F (37.1 C), temperature source Oral, resp. rate 16, height 5' 5.5" (1.664 m), weight 77.565 kg (171 lb), SpO2 99.00%.  Chest clear Heart RRR without murmur Abd gravid, chronic tenderness in periumbilical area,  no rebound or guarding Back negative CVAT Pelvic--cervix long, closed.  Moderate creamy discharge in vault--no blood seen.  Results for orders placed during the hospital encounter of 02/20/11 (from the past 24 hour(s))  URINALYSIS, ROUTINE W REFLEX MICROSCOPIC     Status: Abnormal   Collection Time   02/20/11  4:10 PM      Component Value Range   Color, Urine RED (*) YELLOW    Appearance CLOUDY (*) CLEAR    Specific Gravity, Urine 1.020  1.005 - 1.030    pH 7.5  5.0 - 8.0    Glucose, UA NEGATIVE  NEGATIVE (mg/dL)   Hgb urine dipstick LARGE (*) NEGATIVE    Bilirubin Urine NEGATIVE  NEGATIVE    Ketones, ur NEGATIVE  NEGATIVE (mg/dL)   Protein, ur NEGATIVE  NEGATIVE (mg/dL)   Urobilinogen, UA 0.2  0.0 - 1.0 (mg/dL)   Nitrite POSITIVE (*) NEGATIVE    Leukocytes, UA SMALL (*) NEGATIVE   URINE MICROSCOPIC-ADD ON     Status: Abnormal   Collection Time   02/20/11  4:10 PM      Component Value Range   Squamous Epithelial / LPF RARE  RARE    WBC, UA 11-20  <3 (WBC/hpf)  RBC / HPF TOO NUMEROUS TO COUNT  <3 (RBC/hpf)   Bacteria, UA MANY (*) RARE   WET PREP, GENITAL     Status: Abnormal   Collection Time   02/20/11  7:30 PM      Component Value Range   Yeast, Wet Prep NONE SEEN  NONE SEEN    Trich, Wet Prep NONE SEEN  NONE SEEN    Clue Cells, Wet Prep NONE SEEN  NONE SEEN    WBC, Wet Prep HPF POC MODERATE (*) NONE SEEN    [place]:  Cervical length 3.1.  Placentas without evidence of previa or bleeding. Normal fluid for both babies.  ED Course  Twin pregnancy at 19 weeks Hematuria, with positive UA Chronic abdominal pain from previous hernia repair  Plan: Consulted with Dr. Normand Sloop. GC, chlamydia done. D/C home with urine strainer. Rx Macrobid 1 po BID x 7 days--1st dose now. Keep scheduled appointment next Wednesday or call prn.  Nigel Bridgeman, CNM, [place] 02/20/11 2055

## 2011-02-20 NOTE — Progress Notes (Signed)
V. Latham, CNM at bedside.  Assessment done and poc discussed with pt.  

## 2011-02-20 NOTE — Progress Notes (Signed)
SSE done per V. Emilee Hero, CNM. Wet prep and cultures collected.  VE done.

## 2011-02-20 NOTE — Progress Notes (Signed)
Patient states that she had a lot of blood in her urine when voiding, made the water red, had a stream of blood. Has had generalized abdominal pain for the entire pregnancy believed to be from previous abdominal hernia surgery. No new pain. Patient is pregnant with twins.

## 2011-02-22 LAB — URINE CULTURE
Colony Count: >=100000
Culture  Setup Time: 201211210110
Special Requests: NORMAL

## 2011-02-23 MED ORDER — NITROFURANTOIN MONOHYD MACRO 100 MG PO CAPS: 100.0000 mg | ORAL_CAPSULE | Freq: Two times a day (BID) | ORAL | Status: AC

## 2011-04-03 NOTE — L&D Delivery Note (Signed)
Delivery Note   Jessica [name], Jessica [name] [[id number]]  At 7:18 AM a viable female named [name] was delivered via Vaginal, Spontaneous Delivery (Presentation:vertex ).  APGAR: 8, 9; weight 3 lb 8.1 oz (1590 g).   Placenta status: , .  Cord: 3 vessels with the following complications: .  Anesthesia: None  Episiotomy: None Lacerations: none Est. Blood Loss (mL): 400 cc    Jessica [name], Jessica [name] [[id number]]  At 8:07 AM a viable female named [name] was delivered via Vaginal, Spontaneous Delivery (Presentation: ; Occiput Anterior).  APGAR: 8, 9; weight 3 lb 15.1 oz (1790 g).   Placenta status: Intact, Spontaneous Pathology.  Cord: 3 vessels with the following complications: None Short.  Anesthesia: None  Episiotomy: None Lacerations: None Est. Blood Loss (mL): 400 cc   Mom to postpartum.  Mom consented for PP tubal. R & B reviewed. Will proceed at 12:30 Baby A to NICU.   Baby B to NICU.  Sujay Grundman A 05/26/2011, 10:19 AM

## 2011-04-24 ENCOUNTER — Inpatient Hospital Stay (HOSPITAL_COMMUNITY)
Admission: AD | Admit: 2011-04-24 | Discharge: 2011-05-01 | DRG: 781 | Disposition: A | Discharge status: Home / Self Care | Payer: 59 | Source: Ambulatory Visit | Attending: Obstetrics and Gynecology | Admitting: Obstetrics and Gynecology

## 2011-04-24 ENCOUNTER — Encounter (HOSPITAL_COMMUNITY): Payer: Self-pay

## 2011-04-24 DIAGNOSIS — O479 False labor, unspecified: Secondary | ICD-10-CM

## 2011-04-24 DIAGNOSIS — A499 Bacterial infection, unspecified: Secondary | ICD-10-CM | POA: Diagnosis present

## 2011-04-24 DIAGNOSIS — O4700 False labor before 37 completed weeks of gestation, unspecified trimester: Secondary | ICD-10-CM

## 2011-04-24 DIAGNOSIS — N390 Urinary tract infection, site not specified: Secondary | ICD-10-CM | POA: Diagnosis present

## 2011-04-24 DIAGNOSIS — B9689 Other specified bacterial agents as the cause of diseases classified elsewhere: Secondary | ICD-10-CM | POA: Diagnosis present

## 2011-04-24 DIAGNOSIS — O2390 Unspecified genitourinary tract infection in pregnancy, unspecified trimester: Principal | ICD-10-CM | POA: Diagnosis present

## 2011-04-24 DIAGNOSIS — N760 Acute vaginitis: Secondary | ICD-10-CM | POA: Diagnosis present

## 2011-04-24 DIAGNOSIS — IMO0001 Reserved for inherently not codable concepts without codable children: Secondary | ICD-10-CM

## 2011-04-24 DIAGNOSIS — O30009 Twin pregnancy, unspecified number of placenta and unspecified number of amniotic sacs, unspecified trimester: Secondary | ICD-10-CM

## 2011-04-24 DIAGNOSIS — R109 Unspecified abdominal pain: Secondary | ICD-10-CM | POA: Diagnosis present

## 2011-04-24 DIAGNOSIS — Z3689 Encounter for other specified antenatal screening: Secondary | ICD-10-CM

## 2011-04-24 DIAGNOSIS — O30049 Twin pregnancy, dichorionic/diamniotic, unspecified trimester: Secondary | ICD-10-CM

## 2011-04-24 LAB — URINALYSIS, ROUTINE W REFLEX MICROSCOPIC
Bilirubin Urine: NEGATIVE
Hgb urine dipstick: NEGATIVE
Protein, ur: NEGATIVE mg/dL
Urobilinogen, UA: 0.2 mg/dL (ref 0.0–1.0)

## 2011-04-24 LAB — URINE CULTURE

## 2011-04-24 LAB — URINE MICROSCOPIC-ADD ON

## 2011-04-24 MED ORDER — BETAMETHASONE SOD PHOS & ACET 6 (3-3) MG/ML IJ SUSP
12.0000 mg | INTRAMUSCULAR | Status: AC
  Administered 2011-04-24 – 2011-04-25 (×2): 12 mg via INTRAMUSCULAR
  Filled 2011-04-24 (×2): qty 2

## 2011-04-24 MED ORDER — PRENATAL MULTIVITAMIN CH
1.0000 | ORAL_TABLET | Freq: Every day | ORAL | Status: DC
  Administered 2011-04-25 – 2011-05-01 (×7): 1 via ORAL
  Filled 2011-04-24 (×7): qty 1

## 2011-04-24 MED ORDER — DOCUSATE SODIUM 100 MG PO CAPS
100.0000 mg | ORAL_CAPSULE | Freq: Every day | ORAL | Status: DC
  Administered 2011-04-25 – 2011-05-01 (×7): 100 mg via ORAL
  Filled 2011-04-24 (×7): qty 1

## 2011-04-24 MED ORDER — ZOLPIDEM TARTRATE 10 MG PO TABS
10.0000 mg | ORAL_TABLET | Freq: Every evening | ORAL | Status: DC | PRN
  Administered 2011-04-24 – 2011-05-01 (×7): 10 mg via ORAL
  Filled 2011-04-24 (×7): qty 1

## 2011-04-24 MED ORDER — ACETAMINOPHEN 325 MG PO TABS
650.0000 mg | ORAL_TABLET | ORAL | Status: DC | PRN
  Administered 2011-04-24 – 2011-05-01 (×2): 650 mg via ORAL
  Administered 2011-05-01 (×2): 325 mg via ORAL
  Filled 2011-04-24: qty 2
  Filled 2011-04-24: qty 1
  Filled 2011-04-24 (×2): qty 2

## 2011-04-24 MED ORDER — NIFEDIPINE 10 MG PO CAPS
10.0000 mg | ORAL_CAPSULE | Freq: Four times a day (QID) | ORAL | Status: DC
  Administered 2011-04-24 – 2011-05-01 (×29): 10 mg via ORAL
  Filled 2011-04-24 (×29): qty 1

## 2011-04-24 MED ORDER — NIFEDIPINE 10 MG PO CAPS
20.0000 mg | ORAL_CAPSULE | Freq: Once | ORAL | Status: AC
  Administered 2011-04-24: 20 mg via ORAL
  Filled 2011-04-24: qty 2

## 2011-04-24 MED ORDER — CALCIUM CARBONATE ANTACID 500 MG PO CHEW
2.0000 | CHEWABLE_TABLET | ORAL | Status: DC | PRN
  Administered 2011-04-25 – 2011-04-30 (×7): 400 mg via ORAL
  Filled 2011-04-24: qty 2
  Filled 2011-04-24 (×2): qty 1
  Filled 2011-04-24: qty 2
  Filled 2011-04-24: qty 1
  Filled 2011-04-24: qty 2
  Filled 2011-04-24 (×3): qty 1
  Filled 2011-04-24: qty 2

## 2011-04-24 NOTE — Progress Notes (Signed)
Pt states, " I've had contractions consistently since Saturday and was dilated to 1.5 cm by Dr Estanislado Pandy today.I had a aot od mucus coming out."

## 2011-04-24 NOTE — Plan of Care (Signed)
Problem: Consults Goal: Birthing Suites Patient Information Press F2 to bring up selections list Outcome: Completed/Met Date Met:  04/24/11  Pt < [redacted] weeks EGA

## 2011-04-24 NOTE — H&P (Signed)
Jessica [name] is a 31 y.o.black female presenting from office for preterm labor eval and Positive FFN.  Seen at office today for 1hr gtt and c/o increase in freq of ctxs over the last 4 days and increase in pelvic pressure and pain.  Reported to Dr. Estanislado Pandy up to 10 ctxs noted per day, and also reported increase in mucousy d/c last few days.  Cx was 1/50/soft per Dr. Estanislado Pandy at office and suspected footling breech.  No other cultures sent per pt except FFN.  Pt denies LOF, VB, PIH or UTI s/s.  Has been OOW since around 24 weeks.  Seen for hematuria 02/20/11 and treated w/ Macrobid prophylactically; Gc/Ct cx's done in MAU at that time and both negative.  Followed at [facility] by MD service/ Dr. Stefano Gaul is primary.  Onset of care around 8 weeks.  Pt has had significant abdominal pain from early on in pregnancy and was thought r/t mesh stretching as uterus enlarging w/ multifetal gestation.  She was referred back to Dr. Michaell Cowing, surgeon who did hernia repair, and he did inject local anesthetic into abdominal wall cavity for pain relief, and felt no new hernias present.  Seen by Dr. Michaell Cowing 12/20/10.  Pt had URI early December and treated w/ Zpak.  Last u/s 2 weeks ago and concordant growth w/ both fetuses weighing approximately 1lb 14 oz.   G1=SAB in 2004 G2=EAB in 2006 G3= SVD at 38 weeks by AVS, female, Nov. 2009; ID GDM G4=current pregnancy Maternal Medical History:  Prenatal complications: 1. Twins--di/di 2.  H/o hematuria around 19 weeks 3.  H/o freq UTI's 4.  H/o Pyelo 5.  FH polydactyl 6.  H/o PP depression 7.  H/o umbilical hernia repair w/ mesh 2010 8.  H/o anemia 9.  1st trimester UTI 10.  H/o ID GDM last pregnancy    OB History    Grav Para Term Preterm Abortions TAB SAB Ect Mult Living   3 1 1  0 1 0 1 0 0 1     Past Medical History  Diagnosis Date  . Anemia   . Abdominal pain   . Hernia   . Pyelonephritis   . Gestational diabetes     1st pregnancy, insulin   Past Surgical History    Procedure Date  . Appendectomy 08/1999  . Hernia repair 08/2008    lap supraumb VWH   Family History: family history includes Alcohol abuse in her father; Birth defects in her father and sister; Diabetes in her maternal aunt; Hypertension in her mother; Kidney disease in her maternal aunt; and Stroke in her maternal aunt.  There is no history of Anesthesia problems. Social History:  reports that she has never smoked. She has never used smokeless tobacco. She reports that she does not drink alcohol or use illicit drugs.  Review of Systems  Constitutional: Negative.   Eyes: Negative.   Respiratory: Negative.   Cardiovascular: Negative.   Gastrointestinal: Negative.   Genitourinary: Negative.   Skin: Negative.   Neurological: Negative.       Blood pressure 119/66, pulse 93, temperature 98.5 F (36.9 C), temperature source Oral, resp. rate 20, height 5\' 5"  (1.651 m), weight 83.575 kg (184 lb 4 oz). .. Results for orders placed during the hospital encounter of 04/24/11 (from the past 24 hour(s))  URINALYSIS, ROUTINE W REFLEX MICROSCOPIC     Status: Abnormal   Collection Time   04/24/11 12:53 PM      Component Value Range   Color, Urine YELLOW  YELLOW    APPearance CLOUDY (*) CLEAR    Specific Gravity, Urine 1.010  1.005 - 1.030    pH 6.5  5.0 - 8.0    Glucose, UA 500 (*) NEGATIVE (mg/dL)   Hgb urine dipstick NEGATIVE  NEGATIVE    Bilirubin Urine NEGATIVE  NEGATIVE    Ketones, ur NEGATIVE  NEGATIVE (mg/dL)   Protein, ur NEGATIVE  NEGATIVE (mg/dL)   Urobilinogen, UA 0.2  0.0 - 1.0 (mg/dL)   Nitrite NEGATIVE  NEGATIVE    Leukocytes, UA SMALL (*) NEGATIVE   URINE MICROSCOPIC-ADD ON     Status: Abnormal   Collection Time   04/24/11 12:53 PM      Component Value Range   Squamous Epithelial / LPF FEW (*) RARE    WBC, UA 3-6  <3 (WBC/hpf)   Bacteria, UA MANY (*) RARE    Urine-Other AMORPHOUS URATES/PHOSPHATES     Maternal Exam:  Uterine Assessment: Contraction strength is mild.  UI  every 1-2 minutes on arrival to MAU  Abdomen: Patient reports no abdominal tenderness. Introitus: not evaluated.   Cervix: not evaluated.   Fetal Exam Fetal Monitor Review: Mode: ultrasound.   Baseline rate: 140's x2.  Variability: moderate (6-25 bpm).   Pattern: accelerations present and no decelerations.   No 15x15 accels, but appropriate for gestational age  Fetal State Assessment: Category I - tracings are normal.     Physical Exam  Constitutional: She is oriented to person, place, and time. She appears well-developed and well-nourished.       Pt w/ mild discomfort and shifting in bed frequently when first arrived.  No labored breathing, or guarding, or grimace.  Cardiovascular: Normal rate and regular rhythm.   Respiratory: Effort normal and breath sounds normal.  GI: Soft. Bowel sounds are normal.       Gravid:  s>d  Genitourinary:       Pelvic deferred  Neurological: She is alert and oriented to person, place, and time. She has normal reflexes.  Skin: Skin is warm and dry.    Prenatal labs: ABO, Rh: A/Positive/-- (09/06 0000) Antibody: Negative (09/06 0000) Rubella: Immune (09/06 0000) RPR: Nonreactive (09/06 0000)  HBsAg: Negative (09/06 0000)  HIV: Non-reactive (09/06 0000)  GBS:   not done to date 1hr gtt done 04/24/11  Assessment/Plan: 1.  Twins at 28 weeks 2.  Preterm contractions w/ preterm dilatation 3.  Positive FFN today 4.  H/o abd hernia repair w/ mesh w/ chronic pain during pregnancy 5.  H/o freq UTI/hematuria in pregnancy  1.  Admit to antepartum w/ dr. Normand Sloop as attending 2.  Rout ante orders, BMZ now and repeat tomorrow; begin Procardia 10mg  po q6 per protocol 3.  MFM consult with repeat growth u/s 4.  Urine sent for culture secondary to hx 5.  Mag prn 6.  GBS cx prn & gc/ct prn dirty u/a 7.  MD to follow  Antonietta Breach 04/24/2011, 7:14 PM

## 2011-04-25 ENCOUNTER — Inpatient Hospital Stay (HOSPITAL_COMMUNITY): Payer: 59

## 2011-04-25 ENCOUNTER — Encounter (HOSPITAL_COMMUNITY): Payer: Self-pay | Admitting: *Deleted

## 2011-04-25 LAB — STREP B DNA PROBE: GBS: NEGATIVE

## 2011-04-25 MED ORDER — IBUPROFEN 800 MG PO TABS
800.0000 mg | ORAL_TABLET | Freq: Once | ORAL | Status: AC | PRN
Start: 2011-04-25 — End: 2011-04-25
  Administered 2011-04-25: 800 mg via ORAL
  Filled 2011-04-25: qty 1

## 2011-04-25 NOTE — Progress Notes (Signed)
1000 Monitors not recoginizing a fhr for Baby A after pt returned from bathroom.  Multiple attempts at figuring out problem. [employer] has been called at this time.

## 2011-04-25 NOTE — Consult Note (Signed)
[facility]  Neonatal Medicine Consultation       04/25/2011    9:06 PM  I was called at the request of the patient's obstetrician (Dr. Normand Sloop) to speak to this patient due to preterm labor at 28 weeks, twins.  The patient has been treated with betamethasone and tocolysis.  The fetuses are both female.  I reviewed expected outcome for preterm babies, ranging from 28 to 35 weeks.  We expect these babies to have a high likelihood of survival, but increased risk of various problems of prematurity (respiratory distress, infection, feeding intolerance, etc).  Long term outcome also carries increased risk of neuro-developmental problems, but such risk will decrease progressively as she remains undelivered.  _____________________ Electronically Signed By: Angelita Ingles, MD Neonatologist

## 2011-04-25 NOTE — Progress Notes (Signed)
MFM Note  [name] is a 31 year old G15P1A2 (pt reported G66P1A1) AA female with a di/di twin gestation at 28+1 weeks who was admitted yesterday for premature contraction/premature labor with cervical change. She presented for a routine 28 week visit and reported a few day history of mild contractions and lower pelvic pressure. Digital examination of cervix in the office: 1/50% and soft. FFN was positive. Since admission, she has received a course of BMZ and every 6 hour po Procardia. [name] reports improvement in contractions today.   OB history: SAB at 8 weeks in 2002 not requiring D&C; SVD at 37 weeks in 2009, complicated by GDM controlled with insulin; (TAB not reported to me)  [place]: AGA twin fetuses with normal anatomy and AFV; EV views of cervix: U-shaped funneling with 8-10 mms of distal closed portion (pt did not appear to be in any discomfort from contractions during the [place] and consultation)  Assessment: 1) Dichorionic/diamniotic twin pregnancy at 28+ weeks 2) Preterm cervical change with PTC/PTL 3) H/O abdominal hernia repair with mesh in place 4) Home on short term disability  Recommendations: 1) Recheck cervical exam tomorrow; if no significant change, plan outpatient management with modified bedrest and po Procardia 2) If cervix continues to change in the presence of regular contractions, could try short course of Indocin; would not use magnesium sulfate or terbutaline 3) IM and vaginal progesterone not recommended in twin pregnancies 4) If delivery is imminent, bolus with magnesium sulfate for neuropx  Please contact us with any other questions or concerns. (Orders through [facility] for consultations are not available to [place], so we are unaware that services have been requested. Please have someone call the [facility] for USs and consultations until this issue can be resolved. Thank you.)  (Face-to-face consultation with patient: 30 min)

## 2011-04-25 NOTE — Progress Notes (Addendum)
Jessica [name] is a 31 y.o. [id number] at [redacted]w[redacted]d  admitted for positive FFN and preterm ctxs/dilatation.  Subjective: Able to get some sleep s/p Ambien.  2nd dose BMZ due at 1500.  Did not receive MFM consult/u/s or NICU consult yesterday.  Pt's husband at work, but mom coming this AM.  RN reporting difficulty w/ continuous fetal monitoring.  Pt denies LOF, abnl d/c, VB.  No UTI s/s--cx pending from yesterday.  Pt reports contractions persist despite Procardia, but less than freq on arrival to MAU yesterday.  Objective: BP 104/61  Pulse 108  Temp(Src) 98.2 F (36.8 C) (Oral)  Resp 18  Ht 5\' 5"  (1.651 m)  Wt 83.575 kg (184 lb 4 oz)  BMI 30.66 kg/m2      FHT:  FHR: 140's x2;  bpm, variability: moderate,  accelerations:  Present,  decelerations:  Absent UC:   UI about every 1-2 min SVE:    deferred since admission yesterday  Labs: Lab Results  Component Value Date   WBC 17.3* 02/06/2008   HGB 9.2 DELTA CHECK NOTED* 02/06/2008   HCT 28.8* 02/06/2008   MCV 77.3* 02/06/2008   PLT 257 DELTA CHECK NOTED 02/06/2008    Assessment / Plan: 1.  Twins at 28.1  2.  Positive FFN 1/22  3.  Preterm cx changes  4.  s/p 1 dose BMZ 1/22  4.  1hr gtt done 1/22    Fetal Wellbeing:  Category I  P:  1.  Cont current POC  2.  Will ask Dr. Pennie Rushing if q shift NST appropriate  STEELMAN,CANDICE H 04/25/2011, 9:22 AM  Pt still awaiting MFM consultation and ultrasound. 1 hr glucola fromn 04/24/11 @ [facility]= 113(nl)

## 2011-04-26 NOTE — Progress Notes (Signed)
At the bedside with another RN adjusting cardios for continuous tracing of fhr of Baby A and Baby B for 30 min

## 2011-04-26 NOTE — Progress Notes (Addendum)
cardios intermittently tracing, at the bs for 30 min readj. cardio

## 2011-04-26 NOTE — Progress Notes (Signed)
UR Chart review completed.  

## 2011-04-26 NOTE — Progress Notes (Signed)
cardios removed for therapeutic rest

## 2011-04-26 NOTE — Progress Notes (Signed)
Hospital day # 2 pregnancy at [redacted]w[redacted]d Di-Di twins  S: well, reports good fetal activity X2      Contractions:sporadic      Vaginal bleeding:none now       Vaginal discharge: no significant change  O: BP 120/50  Pulse 108  Temp(Src) 98 F (36.7 C) (Oral)  Resp 18      Fetal tracings:reviewed and reassuring      Uterus gravid and non-tender      Extremities: no significant edema and no signs of DVT      VE: 1 / 50% / -3 footling breech?  Unchanged from previous exam  A: [redacted]w[redacted]d with twins and preterm labor / preterm cervical change     unchanged  P: offered outpatient management with bedrest     Family issues discussed     Patient will review with husband and let me know if able to maintain bedrest at home with 79 yo daughter; if so' will     plan D/C home  [facility] A  MD 04/26/2011 12:23 PM

## 2011-04-27 NOTE — Progress Notes (Addendum)
31 y.o. year old female,at [redacted]w[redacted]d gestation.  SUBJECTIVE:  The patient reports that she still has pressure in her lower abdomen and in her vagina. She reports that her contractions are better than yesterday. She continues to have the pain associated with the mesh that was placed because of her hernia.  OBJECTIVE:  BP 125/68  Pulse 96  Temp(Src) 98.1 F (36.7 C) (Oral)  Resp 18  Ht 5\' 5"  (1.651 m)  Wt 83.575 kg (184 lb 4 oz)  BMI 30.66 kg/m2  SpO2 99%  Fetal Heart Tones:  Both infants have stable fetal heart tones.  Contractions:          The patient has 2-5 moderate contractions each hour. She does have uterine irritability.  Chest: Clear  Heart: Regular rate and rhythm  Abdomen: Nontender  Extremities: Nontender, no masses  ASSESSMENT:  [redacted]w[redacted]d Weeks Pregnancy  Twin gestation  Preterm cervical change  Positive fetal fibronectin  Abdominal pain associated with mesh that was placed for an abdominal hernia  PLAN:  A long discussion was held with the patient about our management options. We discussed the benefits and risks of inpatient management and outpatient management. Her questions were answered. The patient feels that it will be very difficult for her to maintain the level of bed rest required to help her infants remain in utero given that her prior efforts did not prevent her preterm cervical change. Because the patient has a positive fetal fibronectin test, and because I believe that in the long run it is more cost effective for her twins to remain in utero, I feel it is appropriate for the patient to remain under hospital care. We will plan to repeat her fetal fibronectin on a regular basis. If her fetal fibronectin becomes negative, then we will review the recommendation that the patient remain in the hospital. If the patient labors, then we will proceed with cesarean delivery because her first infant is breech. We will consider outpatient management at [redacted] weeks  gestation even if her fetal fibronectin remains positive.  We will continue to use Procardia for uterine contractions.  We will repeat the patient's betamethasone at [redacted] weeks gestation if she is undelivered.  The patient will be given magnesium for neural prophylaxis if she labors.  Leonard Schwartz, M.D.

## 2011-04-27 NOTE — Progress Notes (Signed)
1500 Pt felt lightheaded and dizzy after riding in w/c  Back to bed bp 81/49 dizzy lightheaded pt stated that she was hot room temp hot temp decreased   Feeling better after bp back to normal for pt symptoms went away   1545 pt transferred to room 149

## 2011-04-28 ENCOUNTER — Encounter (HOSPITAL_COMMUNITY): Payer: Self-pay | Admitting: *Deleted

## 2011-04-28 MED ORDER — TERBUTALINE SULFATE 1 MG/ML IJ SOLN
INTRAMUSCULAR | Status: AC
  Administered 2011-04-28: 0.25 mg via SUBCUTANEOUS
  Filled 2011-04-28: qty 1

## 2011-04-28 MED ORDER — TERBUTALINE SULFATE 1 MG/ML IJ SOLN
0.2500 mg | Freq: Once | INTRAMUSCULAR | Status: AC
  Administered 2011-04-28: 0.25 mg via SUBCUTANEOUS

## 2011-04-28 NOTE — Progress Notes (Signed)
scd hose reapplied to pt

## 2011-04-28 NOTE — Progress Notes (Signed)
The patient is considering bilateral tubal sterilization procedure at the time of her delivery. The procedure was discussed. She will make her final decision soon.

## 2011-04-28 NOTE — Progress Notes (Signed)
31 y.o. year old female,at [redacted]w[redacted]d gestation.  SUBJECTIVE:  The patient feels more contractions this morning. Otherwise she is doing well.  OBJECTIVE:  BP 114/55  Pulse 97  Temp(Src) 98.2 F (36.8 C) (Oral)  Resp 20  Ht 5\' 5"  (1.651 m)  Wt 83.575 kg (184 lb 4 oz)  BMI 30.66 kg/m2  SpO2 99%  Fetal Heart Tones:  Stable  Contractions:          9 contractions per hour  Chest: Clear  Heart: Regular rate and rhythm  Abdomen: Gravid, nontender  Extremities: No masses, nontender  Glucola screen in the office: 116  ASSESSMENT:  [redacted]w[redacted]d Weeks Pregnancy  Twins  Preterm labor  More contractions this morning  Positive fetal fibronectin  PLAN:  We will give the patient 1 dose of subcutaneous terbutaline to see if this helps decrease the frequency of her contractions. We will continue her Procardia.  We will continue hospital management.  Leonard Schwartz, M.D.

## 2011-04-29 MED ORDER — SULFAMETHOXAZOLE-TMP DS 800-160 MG PO TABS
1.0000 | ORAL_TABLET | Freq: Two times a day (BID) | ORAL | Status: DC
  Administered 2011-04-30 – 2011-05-01 (×4): 1 via ORAL
  Filled 2011-04-29 (×6): qty 1

## 2011-04-29 NOTE — Progress Notes (Signed)
Pt up to NICU per w/c for NICU tour.  Husband also with pt

## 2011-04-29 NOTE — Progress Notes (Signed)
Pt returned from NICU tour

## 2011-04-29 NOTE — Progress Notes (Signed)
Pt up to bathroom and voided

## 2011-04-29 NOTE — Progress Notes (Signed)
cardios removed

## 2011-04-29 NOTE — Progress Notes (Signed)
31 y.o. year old female,at [redacted]w[redacted]d gestation.  SUBJECTIVE:  Doing well. Contractions are not as uncomfortable.  OBJECTIVE:  BP 117/56  Pulse 101  Temp(Src) 97.8 F (36.6 C) (Oral)  Resp 20  Ht 5\' 5"  (1.651 m)  Wt 83.575 kg (184 lb 4 oz)  BMI 30.66 kg/m2  SpO2 100%  Fetal Heart Tones:  Stable heart tones x2  Contractions:          1-2 per hour  Chest: Clear  Heart: Regular rate and rhythm  Abdomen: Nontender, gravid  Extremities: No masses, nontender  ASSESSMENT:  [redacted]w[redacted]d Weeks Pregnancy  Twin gestation  Preterm cervical change  Positive fetal fibronectin  First infant is in a breech position  PLAN:  We will continue in-hospital management.  Repeat the fetal fibronectin on 05/01/2011.  We will discuss discharging the patient to home on 05/01/2011 if the fetal fibronectin is negative.  The patient labored she will need to have a cesarean section because her presenting infant is breech.  Leonard Schwartz, M.D.

## 2011-04-30 NOTE — Progress Notes (Addendum)
Hospital day # 6 pregnancy at [redacted]w[redacted]d with twins, preterm labor and +FFN.  Planning baby shower.  S:  Doing well, reports good fetal activity x 2      Contractions:  Irritability      Vaginal bleeding:  None       Vaginal discharge: None  O: BP 101/47  Pulse 109  Temp(Src) 98 F (36.7 C) (Oral)  Resp 18  Ht 5\' 5"  (1.651 m)  Wt 83.575 kg (184 lb 4 oz)  BMI 30.66 kg/m2  SpO2 100%       Fetal tracings:  Reactive x 2 on q shift tracing      Uterus gravid and non-tender      Extremities: no significant edema and no signs of DVT  A: [redacted]w[redacted]d with twins, PTL--stable     GBS resulted as negative     UTI--on Bactrim since 04/29/11  P: Continue current plan of care      Plan FFN tomorrow, with possibility of discharge if negative.  Nigel Bridgeman, CNM, [place] 04/30/2011 1:05 PM

## 2011-04-30 NOTE — Progress Notes (Signed)
UR chart review completed.  

## 2011-05-01 LAB — WET PREP, GENITAL
Trich, Wet Prep: NONE SEEN
Yeast Wet Prep HPF POC: NONE SEEN

## 2011-05-01 MED ORDER — METRONIDAZOLE 500 MG PO TABS
500.0000 mg | ORAL_TABLET | Freq: Two times a day (BID) | ORAL | Status: DC
  Administered 2011-05-01: 500 mg via ORAL
  Filled 2011-05-01 (×3): qty 1

## 2011-05-01 MED ORDER — METRONIDAZOLE 500 MG PO TABS: 500.0000 mg | ORAL_TABLET | Freq: Two times a day (BID) | ORAL | Status: AC

## 2011-05-01 MED ORDER — NIFEDIPINE 10 MG PO CAPS: 10.0000 mg | ORAL_CAPSULE | Freq: Four times a day (QID) | ORAL | Status: DC | PRN

## 2011-05-01 NOTE — Progress Notes (Addendum)
Patient ID: Precious Gilding, female   DOB: 1980/09/19, 31 y.o.   MRN: [id number] Adaja Wander is a 31 y.o. [id number] at [redacted]w[redacted]d admitted for Preterm labor  Hospital Day No: 7  Subjective: No complaints.  Denies change in d/c or increased pressure.  Pt has felt drowsy today and not felt any contractions.  Pregnancy complications: preterm labor  Objective: BP 130/66  Pulse 102  Temp(Src) 98.2 F (36.8 C) (Oral)  Resp 18  Ht 5\' 5"  (1.651 m)  Wt 83.575 kg (184 lb 4 oz)  BMI 30.66 kg/m2  SpO2 99%      Physical Exam:  Gen: alert and oriented Chest/Lungs: cta bilaterally  Heart/Pulse: RRR  Abdomen: soft, gravid, nontender, BX x4 quad Uterine fundus: soft, nontender Skin & Color: warm and dry  Neurological: AOx3, DTRs 1+ EXT: negative Homan's b/l, edema 1+  FHT:  FHR: 140s bpm, variability: moderate,  accelerations:  Abscent,  decelerations:  Absent [name] [name] the same however small accels UC:   none SVE:   Dilation: 1.5 Effacement (%): 50 Exam by:: Dr Estanislado Pandy Exam unchanged by AR now, FFN collected, Wet prep collected  Labs: Lab Results  Component Value Date   WBC 17.3* 02/06/2008   HGB 9.2 DELTA CHECK NOTED* 02/06/2008   HCT 28.8* 02/06/2008   MCV 77.3* 02/06/2008   PLT 257 DELTA CHECK NOTED 02/06/2008    Assessment and Plan: P1 @ 29wks with twins admitted with PTL.  Currently stable.  If FFN, will d/c home on bedrest.  If +, will likely d/c home as well on bedrest with close f/u in office.  Fetal status x 2 is reassuring.   Avigail Pilling Y 05/01/2011, 1:56 PM    FFN+ and wet prep with WBC and clue cells.  Will start flagyl and d/c home with bedrest and precautions.  F/u in office on Friday.

## 2011-05-01 NOTE — Discharge Summary (Signed)
ANTENATAL DISCHARGE SUMMARY  Patient ID: Jessica [name] MRN: [id number] DOB/AGE: [birth year] 30 y.o.  Admit date: 04/24/2011 Discharge date: 05/01/2011  Admission Diagnoses: 27WKS,TWINS,DIALATED  Discharge Diagnoses: 27WKS,TWINS,DIALATED         Discharged Condition: stable  Hospital Course: stable cervical exam at 1.5cm/50%/-3 s/p betamethasone.  Dx with BV just prior to d/c home.  FFN was positive.  Patient Active Problem List  Diagnoses Date Noted  . Pregnancy, twins, antepartum 04/24/2011  . Preterm uterine contractions 04/24/2011  . Abdominal wall pain in left paramedian 12/20/2010    Consults: MFM  Treatments: IV hydration, steroids: BMZ and tocolytics  Disposition: home   Medication List  As of 05/01/2011  3:24 PM   TAKE these medications         acetaminophen 500 MG tablet   Commonly known as: TYLENOL   Take 500 mg by mouth every 6 (six) hours as needed. For pain.      calcium carbonate 500 MG chewable tablet   Commonly known as: TUMS - dosed in mg elemental calcium   Chew 2-3 tablets by mouth daily as needed. Heartburn        metroNIDAZOLE 500 MG tablet   Commonly known as: FLAGYL   Take 1 tablet (500 mg total) by mouth every 12 (twelve) hours.      NIFEdipine 10 MG capsule   Commonly known as: PROCARDIA   Take 1 capsule (10 mg total) by mouth every 6 (six) hours as needed.      prenatal vitamin w/FE, FA 27-1 MG Tabs   Take 2 tablets by mouth daily.           Follow-up Information    Follow up with Audrea Muscat, NP on 05/04/2011. (@ 9:30am)    Contact information:   [address] [place] [address] [phone]          Signed: Joelene Millin 05/01/2011, 3:24 PM

## 2011-05-22 ENCOUNTER — Encounter (HOSPITAL_COMMUNITY): Payer: Self-pay | Admitting: *Deleted

## 2011-05-22 ENCOUNTER — Inpatient Hospital Stay (HOSPITAL_COMMUNITY)
Admission: AD | Admit: 2011-05-22 | Discharge: 2011-05-28 | DRG: 767 | Disposition: A | Discharge status: Home / Self Care | Payer: 59 | Source: Ambulatory Visit | Attending: Obstetrics and Gynecology | Admitting: Obstetrics and Gynecology

## 2011-05-22 ENCOUNTER — Inpatient Hospital Stay (HOSPITAL_COMMUNITY): Payer: 59

## 2011-05-22 DIAGNOSIS — O42119 Preterm premature rupture of membranes, onset of labor more than 24 hours following rupture, unspecified trimester: Secondary | ICD-10-CM | POA: Diagnosis present

## 2011-05-22 DIAGNOSIS — O30049 Twin pregnancy, dichorionic/diamniotic, unspecified trimester: Secondary | ICD-10-CM

## 2011-05-22 DIAGNOSIS — O9903 Anemia complicating the puerperium: Secondary | ICD-10-CM | POA: Diagnosis not present

## 2011-05-22 DIAGNOSIS — O4290 Premature rupture of membranes, unspecified as to length of time between rupture and onset of labor, unspecified weeks of gestation: Principal | ICD-10-CM | POA: Diagnosis present

## 2011-05-22 DIAGNOSIS — O30009 Twin pregnancy, unspecified number of placenta and unspecified number of amniotic sacs, unspecified trimester: Secondary | ICD-10-CM | POA: Diagnosis present

## 2011-05-22 DIAGNOSIS — D649 Anemia, unspecified: Secondary | ICD-10-CM | POA: Diagnosis present

## 2011-05-22 DIAGNOSIS — Z302 Encounter for sterilization: Secondary | ICD-10-CM

## 2011-05-22 LAB — CBC
Hemoglobin: 10 g/dL — ABNORMAL LOW (ref 12.0–15.0)
MCH: 23.9 pg — ABNORMAL LOW (ref 26.0–34.0)
MCHC: 31.6 g/dL (ref 30.0–36.0)
MCV: 75.4 fL — ABNORMAL LOW (ref 78.0–100.0)
Platelets: 300 10*3/uL (ref 150–400)

## 2011-05-22 LAB — URINALYSIS, ROUTINE W REFLEX MICROSCOPIC
Glucose, UA: NEGATIVE mg/dL
Hgb urine dipstick: NEGATIVE
Leukocytes, UA: NEGATIVE
Specific Gravity, Urine: 1.01 (ref 1.005–1.030)

## 2011-05-22 LAB — AMNISURE RUPTURE OF MEMBRANE (ROM) NOT AT ARMC: Amnisure ROM: POSITIVE

## 2011-05-22 MED ORDER — SODIUM CHLORIDE 0.9 % IJ SOLN: 3.0000 mL | INTRAMUSCULAR | Status: DC | PRN

## 2011-05-22 MED ORDER — DOCUSATE SODIUM 100 MG PO CAPS
100.0000 mg | ORAL_CAPSULE | Freq: Every day | ORAL | Status: DC
  Administered 2011-05-22 – 2011-05-25 (×4): 100 mg via ORAL
  Filled 2011-05-22 (×5): qty 1

## 2011-05-22 MED ORDER — CALCIUM CARBONATE ANTACID 500 MG PO CHEW
2.0000 | CHEWABLE_TABLET | ORAL | Status: DC | PRN
  Administered 2011-05-22: 400 mg via ORAL
  Filled 2011-05-22: qty 2

## 2011-05-22 MED ORDER — FAMOTIDINE 20 MG PO TABS
20.0000 mg | ORAL_TABLET | Freq: Two times a day (BID) | ORAL | Status: DC
  Administered 2011-05-22 – 2011-05-27 (×9): 20 mg via ORAL
  Filled 2011-05-22 (×12): qty 1

## 2011-05-22 MED ORDER — ACETAMINOPHEN 325 MG PO TABS
650.0000 mg | ORAL_TABLET | ORAL | Status: DC | PRN
  Administered 2011-05-26: 650 mg via ORAL
  Filled 2011-05-22: qty 2

## 2011-05-22 MED ORDER — POLYSACCHARIDE IRON 150 MG PO CAPS
150.0000 mg | ORAL_CAPSULE | Freq: Two times a day (BID) | ORAL | Status: DC
  Administered 2011-05-22 – 2011-05-28 (×11): 150 mg via ORAL
  Filled 2011-05-22 (×15): qty 1

## 2011-05-22 MED ORDER — SODIUM CHLORIDE 0.9 % IJ SOLN
3.0000 mL | Freq: Two times a day (BID) | INTRAMUSCULAR | Status: DC
  Administered 2011-05-22: 3 mL via INTRAVENOUS

## 2011-05-22 MED ORDER — ZOLPIDEM TARTRATE 10 MG PO TABS
10.0000 mg | ORAL_TABLET | Freq: Every evening | ORAL | Status: DC | PRN
  Administered 2011-05-22 – 2011-05-25 (×4): 10 mg via ORAL
  Filled 2011-05-22 (×4): qty 1

## 2011-05-22 MED ORDER — SODIUM CHLORIDE 0.9 % IV SOLN
1.0000 g | Freq: Four times a day (QID) | INTRAVENOUS | Status: DC
  Administered 2011-05-22 – 2011-05-24 (×9): 1 g via INTRAVENOUS
  Filled 2011-05-22 (×11): qty 1000

## 2011-05-22 MED ORDER — LACTATED RINGERS IV SOLN
INTRAVENOUS | Status: DC
  Administered 2011-05-22 – 2011-05-25 (×5): via INTRAVENOUS

## 2011-05-22 MED ORDER — SODIUM CHLORIDE 0.9 % IV SOLN: 250.0000 mL | INTRAVENOUS | Status: DC | PRN

## 2011-05-22 MED ORDER — PRENATAL MULTIVITAMIN CH
1.0000 | ORAL_TABLET | Freq: Every day | ORAL | Status: DC
  Administered 2011-05-22 – 2011-05-25 (×4): 1 via ORAL
  Filled 2011-05-22 (×5): qty 1

## 2011-05-22 MED ORDER — SODIUM CHLORIDE 0.9 % IV SOLN
500.0000 mg | Freq: Two times a day (BID) | INTRAVENOUS | Status: DC
  Administered 2011-05-22 – 2011-05-24 (×5): 500 mg via INTRAVENOUS
  Filled 2011-05-22 (×6): qty 500

## 2011-05-22 NOTE — H&P (Signed)
Todd Argabright is a 31 y.o.black female presenting unannounced at 32 weeks with CC of SROM at 0805 this morning.  Cx was 1/50/soft at her most recent cervical check at office 1-2 weeks ago.  Pt denies any VB, PIH or UTI s/s. Has been OOW since around 24 weeks. Seen for hematuria 02/20/11 and treated w/ Macrobid prophylactically; Gc/Ct cx's done in MAU at that time and both negative. Followed at [facility] by MD service/ Dr. Stefano Gaul is primary. Onset of care around 8 weeks. Pt has had significant abdominal pain from early on in pregnancy and was thought r/t mesh stretching as uterus enlarging w/ multifetal gestation. She was referred back to Dr. Michaell Cowing, surgeon who did hernia repair, and he did inject local anesthetic into abdominal wall cavity for pain relief, and felt no new hernias present. Seen by Dr. Michaell Cowing 12/20/10. Pt had URI early December and treated w/ Zpak. Pt had positive urine cx 04/24/11 for Klebsiella, and treated while hospitalized 1/22-1/29.  She reports urine TOC sent "2 Friday's ago" from office s/p Abx.  She was d/c'd home from hospital on 1/29 on continued bed- and pelvic rest and has been on Procardia for contractions.  She hasn't taken any procardia since yesterday.  She denies any recent illness, fever, or chills; reports regular BM's.  Reports u/s about 2 weeks ago, and presentations were Vtx, Vtx.  Also reports GFMx2.  Pt failed 1hr gtt on 04/24/11, but did do 3hr gtt on 05/16/11 and passed w/ only 1/4 values elevated (see lab values below).   OB HX:    G1=SAB in 2004  G2=EAB in 2006  G3= SVD at 38 weeks by AVS, female, Nov. 2009; ID GDM  G4=current pregnancy   Maternal Medical History:  Reason for admission: Reason for admission: rupture of membranes.  Contractions: Frequency: irregular.   Perceived severity is mild.    Fetal activity: Perceived fetal activity is normal.   Last perceived fetal movement was within the past hour.    Prenatal complications:  1. Twins--di/di 2.  H/o  hematuria around 19 weeks 3.  H/o freq UTI's 4.  H/o Pyelo 5.  FH polydactyl 6.  H/o PP depression 7.  H/o umbilical hernia repair w/ mesh 2010 8.  H/o anemia 9.  1st trimester UTI 10.  H/o ID GDM last pregnancy 11.  Failed 1hr gtt this pregnancy=150; passed 3-hr gtt 05/16/11 w/ only 1/4 values abnormal. 12.  Positive FFN on 04/24/11     OB History    Grav Para Term Preterm Abortions TAB SAB Ect Mult Living   4 1 1  0 2 1 1  0 0 1     Past Medical History  Diagnosis Date  . Anemia   . Abdominal pain   . Hernia   . Pyelonephritis   . Gestational diabetes     1st pregnancy, insulin   Past Surgical History  Procedure Date  . Appendectomy 08/1999  . Hernia repair 08/2008    lap supraumb VWH   Family History: family history includes Alcohol abuse in her father; Birth defects in her father and sister; Diabetes in her maternal aunt; Hypertension in her mother; Kidney disease in her maternal aunt; and Stroke in her maternal aunt.  There is no history of Anesthesia problems. Social History:  reports that she has never smoked. She has never used smokeless tobacco. She reports that she does not drink alcohol or use illicit drugs.  Review of Systems  Constitutional: Negative.   HENT: Negative.  Eyes: Negative.   Respiratory: Negative.   Cardiovascular: Negative.   Gastrointestinal: Negative.   Genitourinary: Negative.   Skin: Negative.   Neurological: Negative.     Dilation: 3.5 Effacement (%): 70 Station: -2 Exam by:: Hillary Blood pressure 133/74, pulse 94, temperature 97.9 F (36.6 C), temperature source Oral, resp. rate 20, height 5\' 3"  (1.6 m), weight 85.73 kg (189 lb), SpO2 100.00%. .. Results for orders placed during the hospital encounter of 05/22/11 (from the past 24 hour(s))  URINALYSIS, ROUTINE W REFLEX MICROSCOPIC     Status: Normal   Collection Time   05/22/11  8:45 AM      Component Value Range   Color, Urine YELLOW  YELLOW    APPearance CLEAR  CLEAR     Specific Gravity, Urine 1.010  1.005 - 1.030    pH 6.5  5.0 - 8.0    Glucose, UA NEGATIVE  NEGATIVE (mg/dL)   Hgb urine dipstick NEGATIVE  NEGATIVE    Bilirubin Urine NEGATIVE  NEGATIVE    Ketones, ur NEGATIVE  NEGATIVE (mg/dL)   Protein, ur NEGATIVE  NEGATIVE (mg/dL)   Urobilinogen, UA 0.2  0.0 - 1.0 (mg/dL)   Nitrite NEGATIVE  NEGATIVE    Leukocytes, UA NEGATIVE  NEGATIVE   AMNISURE RUPTURE OF MEMBRANE (ROM)     Status: Normal   Collection Time   05/22/11  9:23 AM      Component Value Range   Amnisure ROM POSITIVE    CBC     Status: Abnormal   Collection Time   05/22/11 11:22 AM      Component Value Range   WBC 11.6 (*) 4.0 - 10.5 (K/uL)   RBC 4.19  3.87 - 5.11 (MIL/uL)   Hemoglobin 10.0 (*) 12.0 - 15.0 (g/dL)   HCT 65.7 (*) 84.6 - 46.0 (%)   MCV 75.4 (*) 78.0 - 100.0 (fL)   MCH 23.9 (*) 26.0 - 34.0 (pg)   MCHC 31.6  30.0 - 36.0 (g/dL)   RDW 96.2 (*) 95.2 - 15.5 (%)   Platelets 300  150 - 400 (K/uL)    Maternal Exam:  Uterine Assessment: Contraction strength is mild.  Contraction frequency is regular.  2-3 min on arrival to MAU  Abdomen: Patient reports no abdominal tenderness. Fetal presentation: vertex  Introitus: Normal vulva. Normal vagina.  Ferning test: positive.  Nitrazine test: not done. Amniotic fluid character: clear.  Pelvis: adequate for delivery.   Cervix: Cervix evaluated by sterile speculum exam and digital exam.     Fetal Exam Fetal Monitor Review: Mode: ultrasound.   Baseline rate: A--140's B--140s-150.  Variability: moderate (6-25 bpm).   Pattern: accelerations present and no decelerations.    Fetal State Assessment: Category I - tracings are normal.     Physical Exam  Constitutional: She is oriented to person, place, and time. She appears well-developed and well-nourished. No distress.  Cardiovascular: Normal rate and regular rhythm.   Respiratory: Effort normal and breath sounds normal.  GI: Soft. Bowel sounds are normal.       Gravid w/  S>D  Genitourinary:       Positive pooling and ferning; Cx:  3.5/75/-2, posterior, Vtx  Musculoskeletal: She exhibits no edema.  Neurological: She is alert and oriented to person, place, and time. She has normal reflexes.  Skin: Skin is warm and dry.  Psychiatric: She has a normal mood and affect. Her behavior is normal. Judgment and thought content normal.    Prenatal labs: ABO, Rh: A/Positive/-- (09/06  0000) Antibody: Negative (09/06 0000) Rubella: Immune (09/06 0000) RPR: Nonreactive (09/06 0000)  HBsAg: Negative (09/06 0000)  HIV: Non-reactive (09/06 0000)  GBS: Negative (01/29 0000)  1hr gtt 04/24/11=150 3hr gtt 05/16/11:  F=70, 1hr=139, 2hr=148, 3hr=145(H).  U/S TODAY:  Baby A:  HR=143, EFW=3+14 (44%); AFI subjectively WNL; Vtx; 2% Discordancy                        Baby B:  HR=156, EFW= 3+15 (48%); AFI subjectively WNL; Vtx                         CERVIX:  Open.  No measurable length.  Assessment/Plan: 1. Twins at 32 weeks 2. SROM at 0805 this morning 3.  Possible PPROM vs. Early labor 4.  Irregular ctxs 5.  Cat I FHT x 2 6.  S/p BMZ 1/22 & 1/23 7.  H/o positive FFN 1/22 8.  Vtx, Vtx presentation today 9.  Concordant growth on u/s today 10.  Preterm dilatation 11.  Hgb=10  1.  Admit to antenatal w/ routine admission orders w/ Dr. Normand Sloop as attending 2.  Will begin IV antibiotics per PPROM protocol 3.  U/s today for growth, AFI, presentation. 4.  CTO for further s/s of labor, and if no spontaneous labor by 34 weeks, will induce. 5.  MD to follow 6.  Urine culture sent today  STEELMAN,CANDICE H 05/22/2011, 5:18 PM Agree with above.  The pt desires BTL. Risks and benefits and all other birth control methods reviewed with patient and her husband

## 2011-05-22 NOTE — Progress Notes (Signed)
Pt in for possible srom at 0805 of clear fluid.  Reports ucs, but stopped procardia today.  Was a pt on antenatal, received bmz 1/22 and 1/23.  Denies any bleeding. +FM.

## 2011-05-22 NOTE — Progress Notes (Signed)
Patient states she had sudden onset of leaking clear fluid at 0805. Has twins and has been taking Procardia. Has been having contractions.

## 2011-05-23 LAB — URINE CULTURE: Colony Count: 50000

## 2011-05-23 NOTE — Progress Notes (Signed)
UR chart review completed.  

## 2011-05-23 NOTE — Progress Notes (Signed)
31 y.o. year old female,at [redacted]w[redacted]d gestation.  SUBJECTIVE:  The patient continues to leak fluid. Otherwise she is doing well.  OBJECTIVE:  BP 113/66  Pulse 111  Temp(Src) 98.3 F (36.8 C) (Oral)  Resp 22  Ht 5\' 3"  (1.6 m)  Wt 85.73 kg (189 lb)  BMI 33.48 kg/m2  SpO2 100%  Fetal Heart Tones:  Stable nonstress test for both twins.  Contractions:          Few  Chest: Clear  Heart: Regular rate and rhythm  Abdomen: Gravid, nontender  Extremities: Nontender, no masses  CBC    Component Value Date/Time   WBC 11.6* 05/22/2011 1122   RBC 4.19 05/22/2011 1122   HGB 10.0* 05/22/2011 1122   HCT 31.6* 05/22/2011 1122   PLT 300 05/22/2011 1122   MCV 75.4* 05/22/2011 1122   MCH 23.9* 05/22/2011 1122   MCHC 31.6 05/22/2011 1122   RDW 16.7* 05/22/2011 1122      ASSESSMENT:  [redacted]w[redacted]d Weeks Pregnancy  Twin gestation  Rupture membranes for [name]  PLAN:  We will continue with in-hospital management. The patient understands that if she should labor then we will proceed with vaginal deliveries. If she does not labor then we will continue to observe the patient in the hospital and consider delivery at [redacted] weeks gestation.  Leonard Schwartz, M.D.

## 2011-05-24 DIAGNOSIS — D649 Anemia, unspecified: Secondary | ICD-10-CM | POA: Diagnosis present

## 2011-05-24 MED ORDER — AZITHROMYCIN 1 G PO PACK
1.0000 g | PACK | Freq: Once | ORAL | Status: AC
  Administered 2011-05-24: 1 g via ORAL
  Filled 2011-05-24: qty 1

## 2011-05-24 MED ORDER — AMOXICILLIN 500 MG PO CAPS
500.0000 mg | ORAL_CAPSULE | Freq: Three times a day (TID) | ORAL | Status: DC
  Administered 2011-05-24 – 2011-05-26 (×6): 500 mg via ORAL
  Filled 2011-05-24 (×10): qty 1

## 2011-05-24 NOTE — Plan of Care (Signed)
Problem: Consults Goal: Birthing Suites Patient Information Press F2 to bring up selections list   Pt < [redacted] weeks EGA     

## 2011-05-24 NOTE — Progress Notes (Signed)
Jessica [name] is a 31 y.o. G4P1021 at [redacted]w[redacted]d by LMP admitted for PPROM  Subjective: GI: distress associated with IV erythromycin GU: Denies: dysuria, frequency/urgency, hematuria OB: Good fetal movement.  Continues leaking fluid        Objective: BP 132/70  Pulse 104  Temp(Src) 98.1 F (36.7 C) (Oral)  Resp 18  Ht 5\' 3"  (1.6 m)  Wt 198 lb 8 oz (90.039 kg)  BMI 35.16 kg/m2  SpO2 100% I/O last 3 completed shifts: In: 7235 [P.O.:3320; I.V.:3265; Other:100; IV Piggyback:550] Out: 5550 [Urine:5550] Total I/O In: -  Out: 900 [Urine:900]  FHT:  FHR: 140s bpm, variability: moderate,  accelerations:  Present,  decelerations:  Absent UC:   Mild and very irregular SVE:   Dilation: 3.5 Effacement (%): 70 Station: -2 Exam by:: Hillary  Labs: Lab Results  Component Value Date   WBC 11.6* 05/22/2011   HGB 10.0* 05/22/2011   HCT 31.6* 05/22/2011   MCV 75.4* 05/22/2011   PLT 300 05/22/2011    Assessment / Plan: PPROM no labor Twins at 32w, 2 d Vtx/Vtx  Fetal Wellbeing:  Category I   Plan: Switch to po antibiotics for expectant management Monitor with NST 3 times daily Neuroprotective magnesium if labor before 34 weeks Induce at 34 weeks or if nonreassuring fetal status or evidence of infection, Reviewed plan with patient and her mother.  Questions answered.   Nanci Lakatos P 05/24/2011, 3:48 PM

## 2011-05-24 NOTE — Progress Notes (Signed)
No new orders given for ucs q 4-10mins. Will continue to monitor.

## 2011-05-25 MED ORDER — AZITHROMYCIN 1 G PO PACK: 1.0000 g | PACK | Freq: Once | ORAL | Status: DC

## 2011-05-25 NOTE — Progress Notes (Signed)
Hospital day # 3 pregnancy at [redacted]w[redacted]d Twins with PPROM  S: well, reports good fetal activity      Contractions:none, irregular      Vaginal bleeding:none now       Vaginal discharge: watery and no significant change  O: BP 117/56  Pulse 92  Temp(Src) 98.1 F (36.7 C) (Oral)       Fetal tracings: Fetal heart variability: moderate    Fetal Heart Rate decelerations: occasional variable    Fetal Heart Rate accelerations: yes    Baseline FHR: 140-150 per minute    reviewed and reassuring      Uterus gravid and non-tender      Extremities: no significant edema and no signs of DVT  A: [redacted]w[redacted]d with twins and PPROM     stable  P: continue current plan of care  Jessica [name] A  MD 05/25/2011 11:52 AM

## 2011-05-26 ENCOUNTER — Encounter (HOSPITAL_COMMUNITY): Payer: Self-pay | Admitting: Anesthesiology

## 2011-05-26 ENCOUNTER — Inpatient Hospital Stay (HOSPITAL_COMMUNITY): Payer: 59 | Admitting: Anesthesiology

## 2011-05-26 ENCOUNTER — Encounter (HOSPITAL_COMMUNITY)
Admission: AD | Disposition: A | Discharge status: Home / Self Care | Payer: Self-pay | Source: Ambulatory Visit | Attending: Obstetrics and Gynecology

## 2011-05-26 ENCOUNTER — Encounter (HOSPITAL_COMMUNITY): Payer: Self-pay | Admitting: *Deleted

## 2011-05-26 HISTORY — PX: TUBAL LIGATION: SHX77

## 2011-05-26 LAB — CBC
HCT: 30.7 % — ABNORMAL LOW (ref 36.0–46.0)
Hemoglobin: 9.7 g/dL — ABNORMAL LOW (ref 12.0–15.0)
RDW: 16.6 % — ABNORMAL HIGH (ref 11.5–15.5)
WBC: 13 10*3/uL — ABNORMAL HIGH (ref 4.0–10.5)

## 2011-05-26 SURGERY — LIGATION, FALLOPIAN TUBE, POSTPARTUM
Anesthesia: Spinal | Laterality: Bilateral

## 2011-05-26 MED ORDER — MIDAZOLAM HCL 2 MG/2ML IJ SOLN
INTRAMUSCULAR | Status: AC
  Filled 2011-05-26: qty 2

## 2011-05-26 MED ORDER — LANOLIN HYDROUS EX OINT: TOPICAL_OINTMENT | CUTANEOUS | Status: DC | PRN

## 2011-05-26 MED ORDER — MAGNESIUM SULFATE BOLUS VIA INFUSION
4.0000 g | Freq: Once | INTRAVENOUS | Status: AC
  Administered 2011-05-26: 4 g via INTRAVENOUS
  Filled 2011-05-26: qty 500

## 2011-05-26 MED ORDER — BUPIVACAINE IN DEXTROSE 0.75-8.25 % IT SOLN
INTRATHECAL | Status: DC | PRN
  Administered 2011-05-26: 10.5 mg via INTRATHECAL

## 2011-05-26 MED ORDER — BUPIVACAINE HCL (PF) 0.25 % IJ SOLN
INTRAMUSCULAR | Status: DC | PRN
  Administered 2011-05-26: 10 mL

## 2011-05-26 MED ORDER — FENTANYL 2.5 MCG/ML BUPIVACAINE 1/10 % EPIDURAL INFUSION (WH - ANES): 14.0000 mL/h | INTRAMUSCULAR | Status: DC

## 2011-05-26 MED ORDER — FENTANYL CITRATE 0.05 MG/ML IJ SOLN
INTRAMUSCULAR | Status: AC
  Administered 2011-05-26: 50 ug via INTRAVENOUS
  Filled 2011-05-26: qty 2

## 2011-05-26 MED ORDER — BUTORPHANOL TARTRATE 2 MG/ML IJ SOLN
2.0000 mg | INTRAMUSCULAR | Status: DC | PRN
  Administered 2011-05-26: 2 mg via INTRAVENOUS

## 2011-05-26 MED ORDER — LACTATED RINGERS IV SOLN: 500.0000 mL | Freq: Once | INTRAVENOUS | Status: DC

## 2011-05-26 MED ORDER — TETANUS-DIPHTH-ACELL PERTUSSIS 5-2.5-18.5 LF-MCG/0.5 IM SUSP
0.5000 mL | Freq: Once | INTRAMUSCULAR | Status: AC
  Administered 2011-05-27: 0.5 mL via INTRAMUSCULAR
  Filled 2011-05-26: qty 0.5

## 2011-05-26 MED ORDER — PHENYLEPHRINE 40 MCG/ML (10ML) SYRINGE FOR IV PUSH (FOR BLOOD PRESSURE SUPPORT): 80.0000 ug | PREFILLED_SYRINGE | INTRAVENOUS | Status: DC | PRN

## 2011-05-26 MED ORDER — BENZOCAINE-MENTHOL 20-0.5 % EX AERO
1.0000 "application " | INHALATION_SPRAY | CUTANEOUS | Status: DC | PRN
  Administered 2011-05-27 (×2): 1 via TOPICAL

## 2011-05-26 MED ORDER — PROMETHAZINE HCL 25 MG/ML IJ SOLN: 6.2500 mg | INTRAMUSCULAR | Status: DC | PRN

## 2011-05-26 MED ORDER — LIDOCAINE HCL (PF) 1 % IJ SOLN
INTRAMUSCULAR | Status: AC
  Filled 2011-05-26: qty 30

## 2011-05-26 MED ORDER — FENTANYL CITRATE 0.05 MG/ML IJ SOLN
25.0000 ug | INTRAMUSCULAR | Status: DC | PRN
  Administered 2011-05-26: 50 ug via INTRAVENOUS

## 2011-05-26 MED ORDER — PRENATAL MULTIVITAMIN CH
1.0000 | ORAL_TABLET | Freq: Every day | ORAL | Status: DC
  Administered 2011-05-27 – 2011-05-28 (×2): 1 via ORAL
  Filled 2011-05-26 (×2): qty 1

## 2011-05-26 MED ORDER — OXYTOCIN 20 UNITS IN LACTATED RINGERS INFUSION - SIMPLE
INTRAVENOUS | Status: AC
  Administered 2011-05-26: 20 [IU]
  Filled 2011-05-26: qty 1000

## 2011-05-26 MED ORDER — SENNOSIDES-DOCUSATE SODIUM 8.6-50 MG PO TABS
2.0000 | ORAL_TABLET | Freq: Every day | ORAL | Status: DC
  Administered 2011-05-26 – 2011-05-27 (×2): 2 via ORAL

## 2011-05-26 MED ORDER — WITCH HAZEL-GLYCERIN EX PADS: 1.0000 "application " | MEDICATED_PAD | CUTANEOUS | Status: DC | PRN

## 2011-05-26 MED ORDER — SIMETHICONE 80 MG PO CHEW: 80.0000 mg | CHEWABLE_TABLET | ORAL | Status: DC | PRN

## 2011-05-26 MED ORDER — ONDANSETRON HCL 4 MG PO TABS: 4.0000 mg | ORAL_TABLET | ORAL | Status: DC | PRN

## 2011-05-26 MED ORDER — MIDAZOLAM HCL 5 MG/5ML IJ SOLN
INTRAMUSCULAR | Status: DC | PRN
  Administered 2011-05-26: 2 mg via INTRAVENOUS

## 2011-05-26 MED ORDER — BUTORPHANOL TARTRATE 2 MG/ML IJ SOLN
INTRAMUSCULAR | Status: AC
  Filled 2011-05-26: qty 1

## 2011-05-26 MED ORDER — BUPIVACAINE HCL (PF) 0.25 % IJ SOLN
INTRAMUSCULAR | Status: AC
  Filled 2011-05-26: qty 30

## 2011-05-26 MED ORDER — KETOROLAC TROMETHAMINE 30 MG/ML IJ SOLN: 15.0000 mg | Freq: Once | INTRAMUSCULAR | Status: DC | PRN

## 2011-05-26 MED ORDER — ZOLPIDEM TARTRATE 5 MG PO TABS
5.0000 mg | ORAL_TABLET | Freq: Every evening | ORAL | Status: DC | PRN
  Administered 2011-05-27 (×2): 5 mg via ORAL
  Filled 2011-05-26 (×2): qty 1

## 2011-05-26 MED ORDER — OXYTOCIN 20 UNITS IN LACTATED RINGERS INFUSION - SIMPLE
1.0000 m[IU]/min | INTRAVENOUS | Status: DC
  Administered 2011-05-26: 2 m[IU]/min via INTRAVENOUS

## 2011-05-26 MED ORDER — ONDANSETRON HCL 4 MG/2ML IJ SOLN: 4.0000 mg | INTRAMUSCULAR | Status: DC | PRN

## 2011-05-26 MED ORDER — DIPHENHYDRAMINE HCL 50 MG/ML IJ SOLN: 12.5000 mg | INTRAMUSCULAR | Status: DC | PRN

## 2011-05-26 MED ORDER — EPHEDRINE 5 MG/ML INJ: 10.0000 mg | INTRAVENOUS | Status: DC | PRN

## 2011-05-26 MED ORDER — DIPHENHYDRAMINE HCL 25 MG PO CAPS: 25.0000 mg | ORAL_CAPSULE | Freq: Four times a day (QID) | ORAL | Status: DC | PRN

## 2011-05-26 MED ORDER — DIBUCAINE 1 % RE OINT: 1.0000 "application " | TOPICAL_OINTMENT | RECTAL | Status: DC | PRN

## 2011-05-26 MED ORDER — OXYCODONE-ACETAMINOPHEN 5-325 MG PO TABS
1.0000 | ORAL_TABLET | ORAL | Status: DC | PRN
  Administered 2011-05-26 (×2): 2 via ORAL
  Administered 2011-05-27: 1 via ORAL
  Administered 2011-05-27: 2 via ORAL
  Administered 2011-05-27: 1 via ORAL
  Administered 2011-05-27: 2 via ORAL
  Administered 2011-05-27: 1 via ORAL
  Administered 2011-05-28 (×2): 2 via ORAL
  Filled 2011-05-26 (×4): qty 2
  Filled 2011-05-26 (×2): qty 1
  Filled 2011-05-26: qty 2
  Filled 2011-05-26: qty 1
  Filled 2011-05-26: qty 2

## 2011-05-26 MED ORDER — IBUPROFEN 600 MG PO TABS
600.0000 mg | ORAL_TABLET | Freq: Four times a day (QID) | ORAL | Status: DC
  Administered 2011-05-27 – 2011-05-28 (×8): 600 mg via ORAL
  Filled 2011-05-26 (×8): qty 1

## 2011-05-26 MED ORDER — KETOROLAC TROMETHAMINE 30 MG/ML IJ SOLN
INTRAMUSCULAR | Status: AC
  Filled 2011-05-26: qty 1

## 2011-05-26 MED ORDER — KETOROLAC TROMETHAMINE 30 MG/ML IJ SOLN
INTRAMUSCULAR | Status: DC | PRN
  Administered 2011-05-26: 30 mg via INTRAVENOUS

## 2011-05-26 MED ORDER — TERBUTALINE SULFATE 1 MG/ML IJ SOLN: 0.2500 mg | Freq: Once | INTRAMUSCULAR | Status: DC | PRN

## 2011-05-26 MED ORDER — MAGNESIUM SULFATE 40 G IN LACTATED RINGERS - SIMPLE
2.0000 g/h | INTRAVENOUS | Status: DC
  Administered 2011-05-26: 2 g/h via INTRAVENOUS
  Filled 2011-05-26: qty 500

## 2011-05-26 MED ORDER — MEPERIDINE HCL 25 MG/ML IJ SOLN: 6.2500 mg | INTRAMUSCULAR | Status: DC | PRN

## 2011-05-26 MED ORDER — LACTATED RINGERS IV SOLN
INTRAVENOUS | Status: DC
  Administered 2011-05-26 (×2): via INTRAVENOUS

## 2011-05-26 SURGICAL SUPPLY — 33 items
ADH SKN CLS APL DERMABOND .7 (GAUZE/BANDAGES/DRESSINGS) ×1
ADH SKN CLS LQ APL DERMABOND (GAUZE/BANDAGES/DRESSINGS)
APL SKNCLS STERI-STRIP NONHPOA (GAUZE/BANDAGES/DRESSINGS) ×1
BENZOIN TINCTURE PRP APPL 2/3 (GAUZE/BANDAGES/DRESSINGS) ×2 IMPLANT
CHLORAPREP W/TINT 26ML (MISCELLANEOUS) ×2 IMPLANT
CLOTH BEACON ORANGE TIMEOUT ST (SAFETY) ×2 IMPLANT
CONTAINER PREFILL 10% NBF 15ML (MISCELLANEOUS) ×4 IMPLANT
DERMABOND ADHESIVE PROPEN (GAUZE/BANDAGES/DRESSINGS)
DERMABOND ADVANCED (GAUZE/BANDAGES/DRESSINGS) ×1
DERMABOND ADVANCED .7 DNX12 (GAUZE/BANDAGES/DRESSINGS) IMPLANT
DERMABOND ADVANCED .7 DNX6 (GAUZE/BANDAGES/DRESSINGS) IMPLANT
DRSG COVADERM PLUS 2X2 (GAUZE/BANDAGES/DRESSINGS) ×1 IMPLANT
ELECT REM PT RETURN 9FT ADLT (ELECTROSURGICAL) ×2
ELECTRODE REM PT RTRN 9FT ADLT (ELECTROSURGICAL) ×1 IMPLANT
GLOVE BIOGEL PI IND STRL 7.0 (GLOVE) ×2 IMPLANT
GLOVE BIOGEL PI INDICATOR 7.0 (GLOVE) ×2
GLOVE ECLIPSE 6.5 STRL STRAW (GLOVE) ×2 IMPLANT
GLOVE SURG SS PI 7.5 STRL IVOR (GLOVE) ×1 IMPLANT
GOWN PREVENTION PLUS LG XLONG (DISPOSABLE) ×4 IMPLANT
NDL HYPO 25X1 1.5 SAFETY (NEEDLE) ×1 IMPLANT
NEEDLE HYPO 25X1 1.5 SAFETY (NEEDLE) ×2 IMPLANT
NS IRRIG 1000ML POUR BTL (IV SOLUTION) ×2 IMPLANT
PACK ABDOMINAL MINOR (CUSTOM PROCEDURE TRAY) ×2 IMPLANT
PENCIL BUTTON HOLSTER BLD 10FT (ELECTRODE) ×2 IMPLANT
SPONGE LAP 4X18 X RAY DECT (DISPOSABLE) ×1 IMPLANT
STRIP CLOSURE SKIN 1/4X3 (GAUZE/BANDAGES/DRESSINGS) ×2 IMPLANT
SUT CHROMIC GUT AB #0 18 (SUTURE) ×2 IMPLANT
SUT MNCRL AB 4-0 PS2 18 (SUTURE) ×2 IMPLANT
SUT VICRYL 0 UR6 27IN ABS (SUTURE) ×2 IMPLANT
SYR CONTROL 10ML LL (SYRINGE) ×2 IMPLANT
TOWEL OR 17X24 6PK STRL BLUE (TOWEL DISPOSABLE) ×4 IMPLANT
TRAY FOLEY CATH 14FR (SET/KITS/TRAYS/PACK) ×2 IMPLANT
WATER STERILE IRR 1000ML POUR (IV SOLUTION) ×2 IMPLANT

## 2011-05-26 NOTE — Op Note (Signed)
Preop diagnosis: Desire for sterilization  Postop diagnosis: Same  Anesthesia: Spinal  Anesthesiologist: Dr. Arby Barrette  Procedure: Postpartum bilateral tubal ligation  Surgeon: Dr. Estanislado Pandy  Asst.: None  Estimated blood loss: Minimal  Procedure:  After being informed of the planned procedure with possible complications, including bleeding, infection, injury to other organs, irreversibility of tubal ligation and failure rate of 1 in 500,  informed consent is obtained and patient is taken to OR 4.  She is given spinal anesthesia without any complication . She is then placed in  dorsal decubitus position, prepped and draped in a sterile fashion. A Foley catheter is inserted.  After assessing adequate level of anesthesia, we infiltrate the umbilical area using  10 cc of Marcaine 0.25. We then perform a semi-elliptical incision which was brought down bluntly to the fascia. The fascia is identified and grasped with Coker forceps and incised with Mayo scissors. Peritoneum is entered bluntly. Using Trendelenburg position, moist packing and long retractors, we are able to identify the left tube which was then grasped with  Babcock forceps and exteriorized until the fimbria is visualized. We enter the mesosalpinx with cauterization. We then doubly ligate the proximal stump and the distal stump with 0 chromic. A section of 1.5 cm of isthmo-ampullary  tube is then resected. Both stumps are cauterized and hemostasis is adequate. We proceed in the exact same fashion on the right side again after having  visualized the the fimbriae.  All retractors and sponges are removed. The fascia is closed with a running suture of 0 Vicryl. The skin is closed with a subcuticular suture of 3-0 Monocryl and Dermabond.  Instrument and sponge count was complete x2. Estimated blood loss is minimal. The procedure is well tolerated by the patient who is taken to recovery room in a well and stable condition.  Specimen: 2  segments of tube sent to pathology.

## 2011-05-26 NOTE — Brief Op Note (Signed)
05/22/2011 - 05/26/2011  3:41 PM  PATIENT:  Jessica [name]  31 y.o. female  PRE-OPERATIVE DIAGNOSIS:  desires sterilization  POST-OPERATIVE DIAGNOSIS:  desires sterilization    Procedure(s): POST PARTUM TUBAL LIGATION    Surgeon(s): Esmeralda Arthur, MD   ASSISTANTS: none   ANESTHESIA:   local and spinal  LOCAL MEDICATIONS USED:  MARCAINE     SPECIMEN:  segments of tubes  DISPOSITION OF SPECIMEN:  PATHOLOGY  COUNTS:  YES  ESTIMATED BLOOD LOSS: minimal  PATIENT DISPOSITION:  PACU - hemodynamically stable.       [name] 05/26/2011 3:41 PM

## 2011-05-26 NOTE — Anesthesia Procedure Notes (Signed)
Spinal  Patient location during procedure: OR Start time: 05/26/2011 2:37 PM End time: 05/26/2011 2:40 PM Staffing Anesthesiologist: Sandrea Hughs Performed by: anesthesiologist  Preanesthetic Checklist Completed: patient identified, site marked, surgical consent, pre-op evaluation, timeout performed, IV checked, risks and benefits discussed and monitors and equipment checked Spinal Block Patient position: sitting Prep: DuraPrep Patient monitoring: heart rate, cardiac monitor, continuous pulse ox and blood pressure Approach: midline Location: L3-4 Injection technique: single-shot Needle Needle type: Sprotte  Needle gauge: 24 G Needle length: 9 cm Assessment Sensory level: T8

## 2011-05-26 NOTE — Anesthesia Postprocedure Evaluation (Signed)
Anesthesia Post Note  Patient: Jessica [name]  Procedure(s) Performed: Procedure(s) (LRB): POST PARTUM TUBAL LIGATION (Bilateral)  Anesthesia type: Spinal  Patient location: PACU  Post pain: Pain level controlled  Post assessment: Post-op Vital signs reviewed  Last Vitals:  Filed Vitals:   05/26/11 1539  BP: 130/69  Pulse: 81  Temp: 36.8 C  Resp: 20    Post vital signs: Reviewed  Level of consciousness: awake  Complications: No apparent anesthesia complications

## 2011-05-26 NOTE — Transfer of Care (Signed)
Immediate Anesthesia Transfer of Care Note  Patient: Jessica [name]  Procedure(s) Performed: Procedure(s) (LRB): POST PARTUM TUBAL LIGATION (Bilateral)  Patient Location: PACU  Anesthesia Type: Spinal  Level of Consciousness: awake, alert  and patient cooperative  Airway & Oxygen Therapy: Patient Spontanous Breathing  Post-op Assessment: Report given to PACU RN  Post vital signs: Reviewed  Complications: No apparent anesthesia complications

## 2011-05-26 NOTE — Progress Notes (Signed)
S:  Called to antenatal to assess pt.  Pt awoke from sleep w/ urge to pee around 0500, and following void, started having regular, painful ctxs.  No VB.  Pt lying on Lt side on my arrival to room, and had fist balled up and hitting bedside table, rhythmically through each ctxs, and breathing heavily.  O: .Marland Kitchen Filed Vitals:   05/25/11 1646 05/25/11 2000 05/25/11 2305 05/26/11 0515  BP: 128/82 130/67 131/64 131/77  Pulse: 111 99 99 94  Temp: 98.7 F (37.1 C) 98.1 F (36.7 C) 97.8 F (36.6 C) 98.3 F (36.8 C)  TempSrc: Oral Oral Oral Oral  Resp: 20 18 18 20   Height:      Weight:      SpO2:      A:  140s, moderate variability, no decels,  B:  130's, moderate variability, no decles,   TOCO:  UC's q 2-3  Cx:  4cm/90/0 on antenatal;  Just rechecked once transferred to L&D, and 6/90/0  A:  Twins at 34.4, laboring P:  Per c/w Dr. Estanislado Pandy, begin Magnesium sulfate 4gm bolus now, then 2 gm/hr thereafter for neuroprophylaxis      Stadol 2mg  IV x1 now.  C/w MD prn.

## 2011-05-26 NOTE — Anesthesia Preprocedure Evaluation (Signed)
Anesthesia Evaluation  Patient identified by MRN, date of birth, ID band Patient awake    Reviewed: Allergy & Precautions, H&P , NPO status , Patient's Chart, lab work & pertinent test results  Airway Mallampati: I TM Distance: >3 FB Neck ROM: full    Dental No notable dental hx.    Pulmonary neg pulmonary ROS,    Pulmonary exam normal       Cardiovascular neg cardio ROS     Neuro/Psych Negative Neurological ROS  Negative Psych ROS   GI/Hepatic negative GI ROS, Neg liver ROS,   Endo/Other    Renal/GU negative Renal ROS  Genitourinary negative   Musculoskeletal negative musculoskeletal ROS (+)   Abdominal Normal abdominal exam  (+)   Peds  Hematology negative hematology ROS (+)   Anesthesia Other Findings   Reproductive/Obstetrics                           Anesthesia Physical Anesthesia Plan  ASA: II  Anesthesia Plan: Spinal   Post-op Pain Management:    Induction:   Airway Management Planned:   Additional Equipment:   Intra-op Plan:   Post-operative Plan:   Informed Consent: I have reviewed the patients History and Physical, chart, labs and discussed the procedure including the risks, benefits and alternatives for the proposed anesthesia with the patient or authorized representative who has indicated his/her understanding and acceptance.     Plan Discussed with: CRNA  Anesthesia Plan Comments:         Anesthesia Quick Evaluation

## 2011-05-26 NOTE — Progress Notes (Signed)
Magnesium bolus started and pt received 2mg  Stadol IV at 0618 w/ some relief, but does desire epidural. Just rechecked cx and pt remains 6cm, so will proceed with placing epidural ASAP.

## 2011-05-26 NOTE — Consult Note (Signed)
Neonatology Note:   Attendance at Delivery:    I was asked to attend this NSVD at term 32 4/[redacted] weeks GA with twin gestation. The mother is a G4P1A2 A pos, GBS neg with twin pregnancy, PPROM since 2/19, and history of UTI during this pregnancy. She was admitted on 2/19 and received 2 doses of Betamethasone, IV antibiotics (now on po Amoxicillin and Azithromycin), Procardia, and also got magnesium sulfate neuroprophylaxis this morning. Fluid clear. Mother went into rapid labor this morning and delivered [name] soon after that.  [name], a female, was delivered vertex and cried spontaneously at birth. A code Apgar was called by the [facility] nurse as the delivery occurred so quickly, there had not been time to call our team prior to delivery. We arrived at 2-3 minutes of life, at which time the baby was crying, pink, and active, without resp distress. Ap 8/9. Held briefly by the parents, then transported to the NICU fr further care.  [name] delivered vertex, also, with a very short cord fairly tight around the shoulder. Infant vigorous with good spontaneous cry and tone. Needed bulb suctioning. Noted to still be dusky at 3 minutes, so pulse oximeter was placed and reading 61%, so BBO2 was given. Breath sounds were shallow, so the neopuff was used to support breathing with increase in the O2 saturations to 92% in FIO2 50%. Viewed briefly by parents in DR, then transported to the NICU on the neopuff and 50% FIO2. Ap 8/9. 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 The father stayed with the mother in the DR.  I spoke with the parents at length about the condition of both babies, and our plan for their care in the NICU.   Deatra James, MD

## 2011-05-27 LAB — CBC
MCV: 75.7 fL — ABNORMAL LOW (ref 78.0–100.0)
Platelets: 291 10*3/uL (ref 150–400)
RBC: 3.5 MIL/uL — ABNORMAL LOW (ref 3.87–5.11)
RDW: 16.6 % — ABNORMAL HIGH (ref 11.5–15.5)
WBC: 14.1 10*3/uL — ABNORMAL HIGH (ref 4.0–10.5)

## 2011-05-27 MED ORDER — BENZOCAINE-MENTHOL 20-0.5 % EX AERO
INHALATION_SPRAY | CUTANEOUS | Status: AC
  Administered 2011-05-27: 1 via TOPICAL
  Filled 2011-05-27: qty 56

## 2011-05-27 NOTE — Addendum Note (Signed)
Addendum  created 05/27/11 1052 by Edison Pace, CRNA   Modules edited:Notes Section

## 2011-05-27 NOTE — Anesthesia Postprocedure Evaluation (Signed)
  Anesthesia Post-op Note  Patient: Jessica [name]  Procedure(s) Performed: Procedure(s) (LRB): POST PARTUM TUBAL LIGATION (Bilateral)  Patient Location: [facility]  Anesthesia Type: Spinal  Level of Consciousness: awake, alert  and oriented  Airway and Oxygen Therapy: Patient Spontanous Breathing  Post-op Pain: mild  Post-op Assessment: Patient's Cardiovascular Status Stable, Respiratory Function Stable and NAUSEA AND VOMITING PRESENT  Post-op Vital Signs: stable  Complications: No apparent anesthesia complications

## 2011-05-27 NOTE — Progress Notes (Signed)
LCSW attempted visit twice today due to referral for premature twins in the NICU and [facility] was hosting a room full of guests at each attempt.  Plan is to have weekday LCSW attempt follow up.  Staci Acosta, MSW, LCSW, 05/27/2011, 4:01 pm

## 2011-05-27 NOTE — Addendum Note (Signed)
Addendum  created 05/27/11 1052 by Aribelle Mccosh, CRNA   Modules edited:Notes Section    

## 2011-05-27 NOTE — Progress Notes (Signed)
Pt remains in NICU.

## 2011-05-27 NOTE — Progress Notes (Signed)
Pt in NICU

## 2011-05-27 NOTE — Progress Notes (Signed)
Comfortable except for right at tubal incision, pumping no milk yet, little bleeding, feeling good O VSS     H/H 8.5/28     Lungs clear bilaterally, AP RRR, abd soft, rounded, nt, bowel sounds active, ff sm serosa     Flow -Homan's sign bilaterally, no edema A pp day 1 normal involution    Lactating    Anemia    BTL PO day 1 P discharge tomorrow, continue care Lavera Guise, CNM

## 2011-05-28 ENCOUNTER — Encounter (HOSPITAL_COMMUNITY): Payer: Self-pay | Admitting: Obstetrics and Gynecology

## 2011-05-28 DIAGNOSIS — Z302 Encounter for sterilization: Secondary | ICD-10-CM

## 2011-05-28 MED ORDER — FERRALET 90 90-1 MG PO TABS: 1.0000 | ORAL_TABLET | Freq: Every day | ORAL | Status: DC

## 2011-05-28 NOTE — Discharge Summary (Signed)
Obstetric Discharge Summary Reason for Admission: rupture of membranes Prenatal Procedures: NST and ultrasound Intrapartum Procedures: spontaneous vaginal delivery Postpartum Procedures: P.P. tubal ligation Complications-Operative and Postpartum: none Hemoglobin  Date Value Range Status  05/27/2011 8.5* 12.0-15.0 (g/dL) Final     HCT  Date Value Range Status  05/27/2011 26.5* 36.0-46.0 (%) Final  Hospital Course:  Pt presented on 05/22/11 w/ CC of Spontaneous ROM at 0805.  ROM confirmed and cx 3.5 cm, and admitted to antenatal and started on PPROM protocol antibiotics.  Pt did receive u/s on day of admission and concordant fetal growth noted, and both EFW around 3+14.  Pt was having irregular ctxs, which did space on day of admission after IVF started.  Pt w/ intermittent ctxs during next few days, but awoke to void around 0500 Sat morning 05/26/11, and following void, regular, painful ctxs followed.  Cx on ante 4 cm just before 0600, but pt in significant pain and transferred quickly to L&D and started on Magnesium sulfate for neuroprophylaxis.  Pt desired epidural, however while awaiting platelet count result (per anesthesia order), pt continued to labor quickly, and SVD of baby "A" before able to obtain epidural.  "A" delivered about 1 hr after Magnesium sulfate therapy started.  Following [name]'s delivery, cx did retract, and fetal station too high for AROM, so Dr. Estanislado Pandy did start Pitocin.  Following interventions, and eventual AROM of B's membranes at 0805, Twin "B" delivered at 0807, 49 minutes after [name]'s delivery.  Both transferred to NICU, where they will remain despite mom's d/c today.  Pt's PP recovery has been uncomplicated.  She did proceed w/ BTL a few hours after delivery on 05/26/11.  Otherwise, she has been up ad lib, and has started pumping, and has continued on an iron supplement.  No BM since delivery, but did call in Hecker which contains some stool softner.  Pt desired  percocet, and Rx given.  Called in Motrin to [facility] on [address].    Discharge Diagnoses: Premature labor and Preterm birth of Twins at 32.4 weeks; Preterm, Prelabor ROM; Pumping; Persisting Anemia; PP BTL 05/26/11; h/o freq UTIs & Pyelo in the past; H/o PP depression after 1st delivery--no current s/s; h/o hernia repair w/ mesh 2010.  Discharge Information: Date: 05/28/2011 PPD#2 & Post-op Day #2 Activity: pelvic rest Diet: iron-rich Medications: PNV, Ibuprofen, Percocet and Ferralet 90 Condition: stable Instructions: refer to practice specific booklet Discharge to: home Follow-up Information    Follow up with [facility]. Schedule an appointment as soon as possible for a visit in 6 weeks. (or call as needed w/ any questions or concerns)          Newborn Data:   Gato, Girl INDYA OLIVERIA [[id number]]  Live born female "[name]"  (delivery provider:  C. Denny Levy, CNM) Birth Weight: 3 lb 8.1 oz (1590 g) APGAR: 8, 9   Bethea, Girl JOSELYNN AMOROSO [[id number]]  Live born female "Lyric"  (delivery provider:  Dr. Silverio Lay, MD) Birth Weight: 3 lb 15.1 oz (1790 g) APGAR: 8, 9   remain in NICU at Pt's time of d/c..  Analeise Mccleery H 05/28/2011, 1:29 PM

## 2011-05-28 NOTE — Progress Notes (Signed)
Post Partum Day 2 Subjective: up ad lib, voiding, tolerating PO, + flatus and No BM yet.  VB light.  No dizziness when up.  Reports one of twins has [place], but it is to be discontinued today.  Pumping w/ scant output so far.    Objective: Blood pressure 125/73, pulse 77, temperature 98.1 F (36.7 C), temperature source Oral, resp. rate 18, height 5\' 3"  (1.6 m), weight 80.513 kg (177 lb 8 oz), SpO2 99.00%, unknown if currently breastfeeding.  Physical Exam:  General: alert, cooperative and no distress Lochia: appropriate Uterine Fundus: firm, below umbilicus Incision: OTA; dermabond intact; no drainage DVT Evaluation: No evidence of DVT seen on physical exam. Negative Homan's sign. No significant calf/ankle edema.   Basename 05/27/11 0524 05/26/11 0645  HGB 8.5* 9.7*  HCT 26.5* 30.7*    Assessment/Plan: Discharge home and Contraception s/p BTL Anemia--Ferralet 90 Rx called in, as well as Motrin; given Rx for Percocet.  D/c instructions per [facility] pamphlet.  F/u 6 weeks at [facility] or prn.   H/o PPD after previous delivery--no current s/s.  LOS: 6 days   Ladarren Steiner H 05/28/2011, 1:18 PM

## 2011-05-28 NOTE — Progress Notes (Signed)
UR chart review completed.  

## 2011-05-28 NOTE — Progress Notes (Signed)
Pt discharged to home with husband.  Condition stable.  Pt ambulated to car with this RN.  No equipment ordered for home at discharge.

## 2011-05-28 NOTE — Progress Notes (Signed)
Pt currently visiting twins in NICU.  Pt's RN to call me when pt returns to her Room for rounding and d/c home.

## 2011-05-28 NOTE — Discharge Instructions (Signed)
Breastfeeding, Twins or Multiples Mothers of twins or multiples might feel overwhelmed with the idea of breastfeeding more than one baby at a time. It is easier and less expensive to breastfeed twins than to bottle feed them. This is because you do not need to buy infant formula, wash bottles, buy mild soap, and fill the bottles for more than one baby when it is time to feed them. Human milk is especially important for twins, who are often small at birth and need all the advantages breast milk can provide. Breastfeeding also helps create a unique and special bond between the mother and each of her infants.  Mothers of multiples get more benefits from breastfeeding:  Your uterus contracts and returns to its original size faster. This is helpful because it has stretched even more than normal to hold more than one baby.   Hormones are released that relax the mother. This is helpful with the added stress of caring for more than one infant.   The mother often finds she is saving herself time and money, because there is no need to prepare formula or bottles. Your milk is available whenever your babies are ready to feed, at the right temperature, providing optimal nutrition.  If the babies are premature and unable to nurse, you can pump your breasts and freeze the milk until the babies are ready to feed at the breast. To stimulate a milk supply, your breasts need to be emptied at least 8 to 10 times in a 24 hour period. Ask a lactation specialist to help you choose an effective breast pump and to provide guidance in helping your babies latch onto, and feed from, the breast when they are ready. TO GET STARTED: Nurse as soon as possible after birth, and as often as the babies want to do so. This will stimulate your breasts to produce adequate amounts of milk. Mothers of twins almost always produce enough milk for both babies.  TIPS TO INCREASE SUCCESS:  Many mothers of multiples find it easiest to nurse the  babies together. However, if one of the infants is having difficulty latching or sucking, the mother may need to give that baby her full attention when it is time to feed.   Nursing two babies at the same time often gets easier as the babies get older and more experienced at latching onto the breast. Extra pillows under the mother's arms, legs, and under the babies can help this process.   Breastfeeding two babies at once may increase the mother's milk producing hormone (prolactin) levels, and boost her milk production. The more often the babies breastfeed effectively, the more milk the mother will produce.   Switch the babies from one side to the other at alternate feedings. For instance, if baby A feeds from the right breast and baby B feeds from the left breast, then at the next feeding, baby A should take the left breast and baby B the right breast. This ensures that both breasts get equal amounts of stimulation. It also uses the stronger sucking twin to increase the milk supply for the twin whose suck is weaker.   If one of the babies is having difficulty feeding, it may help to try breastfeeding him at the same time as his sibling. The baby with the stronger or more effective suck will stimulate the mother's milk to flow faster. This will encourage his twin to suck and swallow correctly.   It is important to avoid limiting the amount of time   each baby spends feeding at the breast. This allows both babies to obtain the fattier milk that is available at the end of the feeding, when the breast is emptier.   Avoiding bottles and pacifiers during the early weeks will encourage effective sucking patterns and help establish a good milk supply. You should not need supplements if you empty your breasts with each feeding.   A good latch for both infants is important in helping the babies empty the breast effectively, and for avoiding sore nipples. The most common cause of soreness is improper latch-on and  positioning.   In the early days, keep track of each infant's stools and wet diapers, to make sure each baby is getting enough milk. In the first 6 weeks, each baby should have 6 to 8 wet cloth diapers (5 to 6 disposable diapers) and 2 or more bowel movements per day.   There are several positions and holds that make it easier to nurse more than one baby at a time. Ask your nurse or lactation specialist to suggest tips on positioning.   If you know you are having twins, talk with a lactation consultant about more ways you can increase your success at breastfeeding.  Document Released: 07/17/2004 Document Revised: 11/29/2010 Document Reviewed: 02/03/2009 [facility] Patient Information 2012 [place], [place]. Postpartum Care After Vaginal Delivery After you deliver your baby, you will stay in the hospital for 24 to 72 hours, unless there were problems with the labor or delivery, or you have medical problems. While you are in the hospital, you will receive help and instructions on how to care for yourself and your baby. Your doctor will order pain medicine, in case you need it. You will have a small amount of bleeding from your vagina and should change your sanitary pad frequently. Wash your hands thoroughly with soap and water for at least 20 seconds after changing pads and using the toilet. Let the nurses know if you begin to pass blood clots or your bleeding increases. Do not flush blood clots down the toilet before having the nurse look at them, to make sure there is no placental tissue with them. If you had an intravenous (IV), it will be removed within 24 hours, if there are no problems. The first time you get out of bed or take a shower, call the nurse to help you because you may get weak, lightheaded, or even faint. If you are breastfeeding, you may feel painful contractions of your uterus for a couple of weeks. This is normal. The contractions help your uterus get back to normal size. If you are not  breastfeeding, wear a supportive bra and handle your breasts as little as possible until your milk has dried up. Hormones should not be given to dry up the breasts, because they can cause blood clots. You will be given your normal diet, unless you have diabetes or other medical problems.  The nurses may put an ice pack on your episiotomy (surgically enlarged opening), if you have one, to reduce the pain and swelling. On rare occasions, you may not be able to urinate and the nurse will need to empty your bladder with a catheter. If you had a postpartum tubal ligation ("tying tubes," female sterilization), it should not make your stay in the hospital longer. You may have your baby in your room with you as much as you like, unless you or the baby has a problem. Use the bassinet (basket) for the baby when going to and  from the nursery. Do not carry the baby. Do not leave the postpartum area. If the mother is Rh negative (lacks a protein on the red blood cells) and the baby is Rh positive, the mother should get a Rho-gam shot to prevent Rh problems with future pregnancies. You may be given written instructions for you and your baby, and necessary medicines, when you are discharged from the hospital. Be sure you understand and follow the instructions as advised. HOME CARE INSTRUCTIONS   Follow instructions and take the medicines given to you.   Only take over-the-counter or prescription medicines for pain, discomfort, or fever as directed by your caregiver.   Do not take aspirin, because it can cause bleeding.   Increase your activities a little bit every day to build up your strength and endurance.   Do not drink alcohol, especially if you are breastfeeding or taking pain medicine.   Take your temperature twice a day and record it.   You may have a small amount of bleeding or spotting for 2 to 4 weeks. This is normal.   Do not use tampons or douche. Use sanitary pads.   Try to have someone stay and  help you for a few days when you go home.   Try to rest or take a nap when the baby is sleeping.   If you are breastfeeding, wear a good support bra. If you are not breastfeeding, wear a supportive bra and do not stimulate your nipples.   Eat a healthy, nutritious diet and continue to take your prenatal vitamins.   Do not drive, do any heavy activities, or travel until your caregiver tells you it is okay.   Do not have intercourse until your caregiver gives you permission to do so.   Ask your caregiver when you can begin to exercise and what type of exercises to do.   Call your caregiver if you think you are having a problem from your delivery.   Call your pediatrician if you are having a problem with the baby.   Schedule your postpartum visit and keep it.  SEEK MEDICAL CARE IF:   You have a temperature of 100 F (37.8 C) or higher.   You have increased vaginal bleeding or are passing clots. Save any clots to show your caregiver.   You have bloody urine or pain when you urinate.   You have a bad smelling vaginal discharge.   You have increasing pain or swelling on your episiotomy.   You develop a severe headache.   You feel depressed.   The episiotomy is separating.   You become dizzy or lightheaded.   You develop a rash.   You have a reaction or problems with your medicine.   You have pain, redness, or swelling at the intravenous site.  SEEK IMMEDIATE MEDICAL CARE IF:   You have chest pain.   You develop shortness of breath.   You pass out.   You develop pain, with or without swelling or redness in your leg.   You develop heavy vaginal bleeding, with or without blood clots.   You develop stomach pain.   You develop a bad smelling vaginal discharge.  MAKE SURE YOU:   Understand these instructions.   Will watch your condition.   Will get help right away if you are not doing well or get worse.  Document Released: 01/14/2007 Document Revised: 11/29/2010  Document Reviewed: 01/26/2009 [facility] Patient Information 2012 [place], [place]. Tubal Ligation Care After Please read  the instructions outlined below and refer to this sheet in the next few weeks. These discharge instructions give you general information on care after leaving the hospital. Your surgeon may also give you specific instructions. While treatment has been planned according to the most current medical practices available, unavoidable complications sometimes happen. If you have any problems or questions after discharge, please call your surgeon or caregiver. HOME CARE INSTRUCTIONS  Do not drink alcohol, drive a car, use public transportation, or sign important papers for at least 1 day following surgery.   Only take over-the-counter or prescription medicines for pain, discomfort, or fever as directed by your caregiver.   If your surgery was done as a same-day surgery, make sure you have a responsible adult with you for the first 24 hours following your surgery.   You may resume normal diet and activities as directed.   Change dressings as directed.   Do not douche or engage in sexual intercourse until your surgeon has given you permission.  SEEK MEDICAL CARE IF:   You have redness, swelling, or increasing pain in the wound or wound area.   You have pus coming from the wound.   You have a fever.   You have a bad smell coming from a wound or dressing.   Your wound edges separate after sutures or staples have been removed.   You have increasing abdominal pain.   You have excessive vaginal bleeding.   You have increasing pain in your shoulders or chest.   You have dizzy episodes or fainting while standing.   You have persistent nausea or vomiting.  SEEK IMMEDIATE MEDICAL CARE IF:   You have a rash.   You have a hard time breathing.   You feel you are developing any reaction or side effects to medications taken.  MAKE SURE YOU:   Understand these instructions.     Will watch this condition.   Will get help right away if you are not doing well or get worse.  Document Released: 12/23/2003 Document Revised: 11/30/2010 Document Reviewed: 06/12/2007 [facility] Patient Information 2012 [place], [place].

## 2011-07-10 ENCOUNTER — Telehealth: Payer: Self-pay | Admitting: Obstetrics and Gynecology

## 2011-07-10 ENCOUNTER — Ambulatory Visit (INDEPENDENT_AMBULATORY_CARE_PROVIDER_SITE_OTHER): Payer: 59 | Admitting: Obstetrics and Gynecology

## 2011-07-10 ENCOUNTER — Encounter: Payer: Self-pay | Admitting: Obstetrics and Gynecology

## 2011-07-10 VITALS — BP 110/60 | Temp 98.6°F | Ht 64.0 in | Wt 162.0 lb

## 2011-07-10 DIAGNOSIS — N926 Irregular menstruation, unspecified: Secondary | ICD-10-CM

## 2011-07-10 DIAGNOSIS — D649 Anemia, unspecified: Secondary | ICD-10-CM

## 2011-07-10 DIAGNOSIS — N760 Acute vaginitis: Secondary | ICD-10-CM | POA: Insufficient documentation

## 2011-07-10 MED ORDER — METRONIDAZOLE 0.75 % VA GEL: 1.0000 | Freq: Two times a day (BID) | VAGINAL | Status: AC

## 2011-07-10 NOTE — Progress Notes (Signed)
  Subjective:    Patient ID: Jessica [name], female    DOB: 1980/07/31, 31 y.o.   MRN: [id number]  HPI  Vaginal delivery of twins on 05/26/11. BTL. C/O heavy bleeding like a period. Sexually active. PP hgb 8.5. Abd pain all gone.   Review of Systems  Nl GI and GU function.    Objective:   Physical Exam   VS stable. Abd soft, NT Pelvic: Mod bleeding             Uterus nontender, nl size             Adn no masses     Assessment & Plan:   First period PP  Check cbc  Obs periods  Metrogel for vaginal odor  RTO 2 months for annual exam  Breast feeding

## 2011-07-10 NOTE — Patient Instructions (Signed)
Anemia, Nonspecific Your exam and blood tests show you are anemic. This means your blood (hemoglobin) level is low. Normal hemoglobin values are 12 to 15 g/dL for females and 14 to 17 g/dL for males. Make a note of your hemoglobin level today. The hematocrit percent is also used to measure anemia. A normal hematocrit is 38% to 46% in females and 42% to 49% in males. Make a note of your hematocrit level today. CAUSES  Anemia can be due to many different causes.  Excessive bleeding from periods (in women).   Intestinal bleeding.   Poor nutrition.   Kidney, thyroid, liver, and bone marrow diseases.  SYMPTOMS  Anemia can come on suddenly (acute). It can also come on slowly. Symptoms can include:  Minor weakness.   Dizziness.   Palpitations.   Shortness of breath.  Symptoms may be absent until half your hemoglobin is missing if it comes on slowly. Anemia due to acute blood loss from an injury or internal bleeding may require blood transfusion if the loss is severe. Hospital care is needed if you are anemic and there is significant continual blood loss. TREATMENT   Stool tests for blood (Hemoccult) and additional lab tests are often needed. This determines the best treatment.   Further checking on your condition and your response to treatment is very important. It often takes many weeks to correct anemia.  Depending on the cause, treatment can include:  Supplements of iron.   Vitamins B12 and folic acid.   Hormone medicines.If your anemia is due to bleeding, finding the cause of the blood loss is very important. This will help avoid further problems.  SEEK IMMEDIATE MEDICAL CARE IF:   You develop fainting, extreme weakness, shortness of breath, or chest pain.   You develop heavy vaginal bleeding.   You develop bloody or black, tarry stools or vomit up blood.   You develop a high fever, rash, repeated vomiting, or dehydration.  Document Released: 04/26/2004 Document Revised:  03/08/2011 Document Reviewed: 02/01/2009 Exit[employer] Patient Information 2012 [employer]. 

## 2011-07-11 ENCOUNTER — Ambulatory Visit: Payer: Self-pay | Admitting: Obstetrics and Gynecology

## 2011-07-11 LAB — CBC WITH DIFFERENTIAL/PLATELET
Hemoglobin: 11.8 g/dL — ABNORMAL LOW (ref 12.0–15.0)
Lymphocytes Relative: 45 % (ref 12–46)
Lymphs Abs: 3.6 10*3/uL (ref 0.7–4.0)
MCH: 23.5 pg — ABNORMAL LOW (ref 26.0–34.0)
Monocytes Relative: 7 % (ref 3–12)
Neutro Abs: 3.5 10*3/uL (ref 1.7–7.7)
Neutrophils Relative %: 44 % (ref 43–77)
Platelets: 412 10*3/uL — ABNORMAL HIGH (ref 150–400)
RBC: 5.03 MIL/uL (ref 3.87–5.11)
WBC: 8 10*3/uL (ref 4.0–10.5)

## 2011-09-12 ENCOUNTER — Encounter: Payer: 59 | Admitting: Obstetrics and Gynecology

## 2012-01-27 ENCOUNTER — Emergency Department (INDEPENDENT_AMBULATORY_CARE_PROVIDER_SITE_OTHER)
Admission: EM | Admit: 2012-01-27 | Discharge: 2012-01-27 | Disposition: A | Discharge status: Home / Self Care | Payer: 59 | Source: Home / Self Care | Attending: Family Medicine | Admitting: Family Medicine

## 2012-01-27 ENCOUNTER — Encounter (HOSPITAL_COMMUNITY): Payer: Self-pay

## 2012-01-27 DIAGNOSIS — N390 Urinary tract infection, site not specified: Secondary | ICD-10-CM

## 2012-01-27 LAB — POCT URINALYSIS DIP (DEVICE)
Bilirubin Urine: NEGATIVE
Glucose, UA: NEGATIVE mg/dL
Ketones, ur: NEGATIVE mg/dL
Specific Gravity, Urine: 1.02 (ref 1.005–1.030)
Urobilinogen, UA: 0.2 mg/dL (ref 0.0–1.0)

## 2012-01-27 LAB — POCT PREGNANCY, URINE: Preg Test, Ur: NEGATIVE

## 2012-01-27 MED ORDER — CEFTRIAXONE SODIUM 1 G IJ SOLR
1.0000 g | Freq: Once | INTRAMUSCULAR | Status: AC
  Administered 2012-01-27: 1 g via INTRAMUSCULAR

## 2012-01-27 MED ORDER — ONDANSETRON HCL 4 MG PO TABS: 4.0000 mg | ORAL_TABLET | Freq: Three times a day (TID) | ORAL | Status: DC | PRN

## 2012-01-27 MED ORDER — CEPHALEXIN 500 MG PO CAPS: 500.0000 mg | ORAL_CAPSULE | Freq: Four times a day (QID) | ORAL | Status: DC

## 2012-01-27 MED ORDER — ONDANSETRON 4 MG PO TBDP
8.0000 mg | ORAL_TABLET | Freq: Once | ORAL | Status: AC
  Administered 2012-01-27: 8 mg via ORAL

## 2012-01-27 MED ORDER — CEFTRIAXONE SODIUM 1 G IJ SOLR
INTRAMUSCULAR | Status: AC
  Filled 2012-01-27: qty 10

## 2012-01-27 MED ORDER — LIDOCAINE HCL (PF) 1 % IJ SOLN
INTRAMUSCULAR | Status: AC
  Filled 2012-01-27: qty 5

## 2012-01-27 MED ORDER — ONDANSETRON 4 MG PO TBDP
ORAL_TABLET | ORAL | Status: AC
  Filled 2012-01-27: qty 2

## 2012-01-27 NOTE — ED Notes (Signed)
C/o RLQ, right flank pain since Thursday. Pain "8" on 0-10 scale , constant , worse at times , worse w exertion or on the ride over ; states she has had a slight vaginal d/c  ; hist of tubal ligation, appendectomy ; LMP 10 days ago; looks uncomfortable

## 2012-01-27 NOTE — ED Notes (Signed)
Patient vomited moderate amt of undigested food, reddish tinted

## 2012-01-27 NOTE — ED Provider Notes (Signed)
History     CSN: [id number]  Arrival date & time 01/27/12  0904   First MD Initiated Contact with Patient 01/27/12 [phone]      Chief Complaint  Patient presents with  . Abdominal Pain    (Consider location/radiation/quality/duration/timing/severity/associated sxs/prior treatment) HPI Comments: 31 year old female 8 months postpartum by vaginal delivery of twin babies. Here complaining of right lower quadrant and right flank pain associated with dysuria frequency and "bladder pressure" for 3 days. Denies fever or chills. Reports headache and nausea. Had one episode of emesis here (food content). Reports felt mildly nauseous yesterday but was able to tolerate solids and fluids; symptoms worse today. Patient is status post partum bilateral tubal ligation. And also has a past history of  appendectomy. Patient states she is coming out of her menstrual period and has noticed slight vaginal discharge before the beginning of her period that was not associated to vaginal discomfort or pelvic pain.   Past Medical History  Diagnosis Date  . Anemia   . Abdominal pain   . Hernia   . Pyelonephritis   . Gestational diabetes     1st pregnancy, insulin    Past Surgical History  Procedure Date  . Appendectomy 08/1999  . Hernia repair 08/2008    lap supraumb [facility]  . Tubal ligation 05/26/2011    Procedure: POST PARTUM TUBAL LIGATION;  Surgeon: Esmeralda Arthur, MD;  Location: [facility];  Service: Gynecology;  Laterality: Bilateral;    Family History  Problem Relation Age of Onset  . Hypertension Mother   . Anesthesia problems Neg Hx   . Alcohol abuse Father   . Birth defects Father     polydactyly  . Birth defects Sister     polydactyly  . Stroke Maternal Aunt   . Kidney disease Maternal Aunt   . Diabetes Maternal Aunt     History  Substance Use Topics  . Smoking status: Never Smoker   . Smokeless tobacco: Never Used  . Alcohol Use: No    OB History    Grav Para Term Preterm Abortions  TAB SAB Ect Mult Living   4 2 1 1 2 1 1  0 1 3      Review of Systems  Constitutional: Negative for fever.  Gastrointestinal: Positive for abdominal pain.  Genitourinary: Positive for dysuria, urgency, frequency and flank pain.  Musculoskeletal: Negative for back pain.  Skin: Negative for rash.  Neurological: Positive for headaches.  All other systems reviewed and are negative.    Allergies  Vicodin  Home Medications   Current Outpatient Rx  Name Route Sig Dispense Refill  . ACETAMINOPHEN 500 MG PO TABS Oral Take 500 mg by mouth every 6 (six) hours as needed. For pain.    Marland Kitchen CALCIUM CARBONATE ANTACID 500 MG PO CHEW Oral Chew 2-3 tablets by mouth daily as needed. Heartburn      . CEPHALEXIN 500 MG PO CAPS Oral Take 1 capsule (500 mg total) by mouth 4 (four) times daily. 28 capsule 0  . FERRALET 90 90-1 MG PO TABS Oral Take 1 tablet by mouth daily. 60 each 2  . ONDANSETRON HCL 4 MG PO TABS Oral Take 1 tablet (4 mg total) by mouth every 8 (eight) hours as needed for nausea. 10 tablet 0  . PRENATAL PLUS 27-1 MG PO TABS Oral Take 2 tablets by mouth daily.        BP 92/57  Pulse 86  Temp 98.8 F (37.1 C) (Oral)  Resp 18  SpO2 100%  LMP 01/20/2012  Physical Exam  Nursing note and vitals reviewed. Constitutional: She is oriented to person, place, and time. She appears well-developed and well-nourished.       Uncomfortable with headache and nausea.  HENT:  Head: Normocephalic and atraumatic.  Mouth/Throat: Oropharynx is clear and moist.  Eyes: Conjunctivae normal are normal. No scleral icterus.  Neck: Neck supple. No thyromegaly present.  Cardiovascular: Normal rate, regular rhythm and normal heart sounds.   Pulmonary/Chest: Effort normal and breath sounds normal.  Abdominal: Soft. Bowel sounds are normal. She exhibits no distension and no mass.       Suprapubic tenderness with deep palpation. No guarding. No rebound. No costovertebral tenderness  Lymphadenopathy:    She  has no cervical adenopathy.  Neurological: She is alert and oriented to person, place, and time.  Skin: No rash noted.    ED Course  Procedures (including critical care time)  Labs Reviewed  POCT URINALYSIS DIP (DEVICE) - Abnormal; Notable for the following:    Hgb urine dipstick SMALL (*)     Protein, ur 30 (*)     Nitrite POSITIVE (*)     Leukocytes, UA LARGE (*)  Biochemical Testing Only. Please order routine urinalysis from main lab if confirmatory testing is needed.   All other components within normal limits  POCT PREGNANCY, URINE  URINE CULTURE   No results found.   1. Urinary tract infection       MDM  31 y/o female here with low abdominal pain, dysuria and nausea. Afebrile. Pathologic urine. Impress low UTI but symptoms also concerning for early Pyelo although no CVT or fever. Patient treated with rocephin 1g IM x1 and ondansetron 8 mg SL x1. Reported symptoms improvement. Tolerating fluids with no emesis. Prescribed cephalexin,ondansetron. Asked to return if new onset of fever or worsening symptoms despite following treatment. Urine culture pending. Asked to follow up with PCP next week to monitor symptoms.        Sharin Grave, MD 01/28/12 [phone]

## 2012-01-28 LAB — URINE CULTURE: Culture: NO GROWTH

## 2012-07-27 ENCOUNTER — Encounter (HOSPITAL_COMMUNITY): Payer: Self-pay | Admitting: Emergency Medicine

## 2012-07-27 ENCOUNTER — Emergency Department (HOSPITAL_COMMUNITY)
Admission: EM | Admit: 2012-07-27 | Discharge: 2012-07-27 | Disposition: A | Discharge status: Home / Self Care | Payer: 59 | Attending: Emergency Medicine | Admitting: Emergency Medicine

## 2012-07-27 DIAGNOSIS — D649 Anemia, unspecified: Secondary | ICD-10-CM | POA: Insufficient documentation

## 2012-07-27 DIAGNOSIS — R42 Dizziness and giddiness: Secondary | ICD-10-CM | POA: Insufficient documentation

## 2012-07-27 DIAGNOSIS — R112 Nausea with vomiting, unspecified: Secondary | ICD-10-CM | POA: Insufficient documentation

## 2012-07-27 DIAGNOSIS — N390 Urinary tract infection, site not specified: Secondary | ICD-10-CM

## 2012-07-27 DIAGNOSIS — R300 Dysuria: Secondary | ICD-10-CM | POA: Insufficient documentation

## 2012-07-27 DIAGNOSIS — Z87448 Personal history of other diseases of urinary system: Secondary | ICD-10-CM | POA: Insufficient documentation

## 2012-07-27 DIAGNOSIS — R55 Syncope and collapse: Secondary | ICD-10-CM

## 2012-07-27 DIAGNOSIS — Z862 Personal history of diseases of the blood and blood-forming organs and certain disorders involving the immune mechanism: Secondary | ICD-10-CM | POA: Insufficient documentation

## 2012-07-27 DIAGNOSIS — Z8719 Personal history of other diseases of the digestive system: Secondary | ICD-10-CM | POA: Insufficient documentation

## 2012-07-27 DIAGNOSIS — Z79899 Other long term (current) drug therapy: Secondary | ICD-10-CM | POA: Insufficient documentation

## 2012-07-27 DIAGNOSIS — Z8639 Personal history of other endocrine, nutritional and metabolic disease: Secondary | ICD-10-CM | POA: Insufficient documentation

## 2012-07-27 LAB — CBC WITH DIFFERENTIAL/PLATELET
Basophils Relative: 0 % (ref 0–1)
Hemoglobin: 11.1 g/dL — ABNORMAL LOW (ref 12.0–15.0)
Lymphs Abs: 1.9 10*3/uL (ref 0.7–4.0)
Monocytes Relative: 6 % (ref 3–12)
Neutro Abs: 10.2 10*3/uL — ABNORMAL HIGH (ref 1.7–7.7)
Neutrophils Relative %: 79 % — ABNORMAL HIGH (ref 43–77)
Platelets: 389 10*3/uL (ref 150–400)
RBC: 4.72 MIL/uL (ref 3.87–5.11)

## 2012-07-27 LAB — URINALYSIS, ROUTINE W REFLEX MICROSCOPIC
Nitrite: POSITIVE — AB
Specific Gravity, Urine: 1.02 (ref 1.005–1.030)
Urobilinogen, UA: >8 mg/dL — ABNORMAL HIGH (ref 0.0–1.0)
pH: 5.5 (ref 5.0–8.0)

## 2012-07-27 LAB — URINE MICROSCOPIC-ADD ON

## 2012-07-27 LAB — BASIC METABOLIC PANEL
BUN: 8 mg/dL (ref 6–23)
Chloride: 103 mEq/L (ref 96–112)
GFR calc Af Amer: >90 mL/min (ref >90)
Glucose, Bld: 99 mg/dL (ref 70–99)
Potassium: 3.9 mEq/L (ref 3.5–5.1)
Sodium: 136 mEq/L (ref 135–145)

## 2012-07-27 MED ORDER — SODIUM CHLORIDE 0.9 % IV BOLUS (SEPSIS)
1000.0000 mL | Freq: Once | INTRAVENOUS | Status: AC
  Administered 2012-07-27: 1000 mL via INTRAVENOUS

## 2012-07-27 MED ORDER — SULFAMETHOXAZOLE-TMP DS 800-160 MG PO TABS
1.0000 | ORAL_TABLET | Freq: Once | ORAL | Status: AC
  Administered 2012-07-27: 1 via ORAL
  Filled 2012-07-27: qty 1

## 2012-07-27 MED ORDER — SULFAMETHOXAZOLE-TMP DS 800-160 MG PO TABS: 1.0000 | ORAL_TABLET | Freq: Two times a day (BID) | ORAL | Status: DC

## 2012-07-27 NOTE — ED Notes (Signed)
Pt in church and became suddenly hot and weak. Loc x 1 min per witnesses told EMS. Pt arrived alert/oriented. Lethargy/gen weakness noted. Pt states her head feels heavy with gen weakness. cbg in route [address].

## 2012-07-27 NOTE — ED Provider Notes (Signed)
History     CSN: [id number]  Arrival date & time 07/27/12  1758   First MD Initiated Contact with Patient 07/27/12 1809      Chief Complaint  Patient presents with  . Weakness   HPI  Patient presents after an episode of near syncope. She recalls a period of prodromal lightheadedness, nausea.  She subsequently nearly loss consciousness, had an episode of vomiting. Upon regaining her faculties, she had no chest pain, no headache.  She also denies any prior chest pain, headache. On my evaluation she has minimal complaints aside from mild generalized sense of discomfort. She states that she is generally healthy. Chest 3 children. She has a history of tubal ligation. She states that the only recent health issue for her has been recent dysuria, with no formal evaluation thus far. This began today.   Past Medical History  Diagnosis Date  . Anemia   . Abdominal pain   . Hernia   . Pyelonephritis   . Gestational diabetes     Past Surgical History  Procedure Laterality Date  . Appendectomy  08/1999  . Hernia repair  08/2008    lap supraumb [facility]  . Tubal ligation  05/26/2011    Procedure: POST PARTUM TUBAL LIGATION;  Surgeon: Esmeralda Arthur, MD;  Location: [facility];  Service: Gynecology;  Laterality: Bilateral;    Family History  Problem Relation Age of Onset  . Hypertension Mother   . Anesthesia problems Neg Hx   . Alcohol abuse Father   . Birth defects Father     polydactyly  . Birth defects Sister     polydactyly  . Stroke Maternal Aunt   . Kidney disease Maternal Aunt   . Diabetes Maternal Aunt     History  Substance Use Topics  . Smoking status: Never Smoker   . Smokeless tobacco: Never Used  . Alcohol Use: No    OB History   Grav Para Term Preterm Abortions TAB SAB Ect Mult Living   4 2 1 1 2 1 1  0 1 3      Review of Systems  All other systems reviewed and are negative.    Allergies  Vicodin  Home Medications   Current Outpatient Rx  Name  Route   Sig  Dispense  Refill  . acetaminophen (TYLENOL) 500 MG tablet   Oral   Take 500 mg by mouth every 6 (six) hours as needed. For pain.         . calcium carbonate (TUMS - DOSED IN MG ELEMENTAL CALCIUM) 500 MG chewable tablet   Oral   Chew 2-3 tablets by mouth daily as needed. Heartburn           . cephALEXin (KEFLEX) 500 MG capsule   Oral   Take 1 capsule (500 mg total) by mouth 4 (four) times daily.   28 capsule   0   . Fe Cbn-Fe Gluc-FA-B12-C-DSS (FERRALET 90) 90-1 MG TABS   Oral   Take 1 tablet by mouth daily.   60 each   2   . ondansetron (ZOFRAN) 4 MG tablet   Oral   Take 1 tablet (4 mg total) by mouth every 8 (eight) hours as needed for nausea.   10 tablet   0   . prenatal vitamin w/FE, FA (PRENATAL 1 + 1) 27-1 MG TABS   Oral   Take 2 tablets by mouth daily.             BP 108/70  Pulse 82  Temp(Src) 99 F (37.2 C) (Oral)  Resp 17  SpO2 100%  LMP 07/02/2012  Breastfeeding? No  Physical Exam  Nursing note and vitals reviewed. Constitutional: She is oriented to person, place, and time. She appears well-developed and well-nourished. No distress.  HENT:  Head: Normocephalic and atraumatic.  Eyes: Conjunctivae and EOM are normal.  Cardiovascular: Normal rate and regular rhythm.   Pulmonary/Chest: Effort normal and breath sounds normal. No stridor. No respiratory distress.  Abdominal: She exhibits no distension.  Musculoskeletal: She exhibits no edema.  Neurological: She is alert and oriented to person, place, and time. No cranial nerve deficit. She exhibits normal muscle tone. Coordination normal.  Skin: Skin is warm and dry.  Psychiatric: She has a normal mood and affect.    ED Course  Procedures (including critical care time)  Labs Reviewed  URINALYSIS, ROUTINE W REFLEX MICROSCOPIC - Abnormal; Notable for the following:    Color, Urine ORANGE (*)    APPearance HAZY (*)    Glucose, UA 250 (*)    Hgb urine dipstick LARGE (*)    Bilirubin Urine  MODERATE (*)    Ketones, ur TRACE (*)    Protein, ur 100 (*)    Urobilinogen, UA >8.0 (*)    Nitrite POSITIVE (*)    Leukocytes, UA SMALL (*)    All other components within normal limits  URINE MICROSCOPIC-ADD ON - Abnormal; Notable for the following:    Squamous Epithelial / LPF MANY (*)    Bacteria, UA MANY (*)    All other components within normal limits  URINE CULTURE  CBC WITH DIFFERENTIAL  BASIC METABOLIC PANEL   No results found.   No diagnosis found.  Pulse ox 99% room air normal   Patient's initial evaluation demonstrates a urinary tract infection   Date: 07/27/2012  Rate: 86  Rhythm: normal sinus rhythm  QRS Axis: normal  Intervals: normal  ST/T Wave abnormalities: normal  Conduction Disutrbances:none  Narrative Interpretation:   Old EKG Reviewed: none available  Unremarkable  MDM  This young female presents after a prodromal near-syncopal episode at church.  On exam she is awake, alert, appropriate interactive with no neurologic deficits.  The patient's endorsement of ongoing dysuria suggests infection, and this is demonstrated on her urinalysis.  The remainder of the patient's evaluation is largely unremarkable.  Given the absence of known disease in the absence of distress or focal findings on exam, he was provided for discharge with initiation of antibiotics.  This episode is most consistent with a vagal mediated episode of near-syncope, likely with contribution from the ongoing infection.        Gerhard Munch, MD 07/27/12 [phone]

## 2012-07-30 LAB — URINE CULTURE

## 2012-07-31 ENCOUNTER — Telehealth (HOSPITAL_COMMUNITY): Payer: Self-pay | Admitting: Emergency Medicine

## 2012-07-31 NOTE — ED Notes (Signed)
+  Urine. Patient treated with Septra DS. Sensitive to same. Per protocol MD. °

## 2012-07-31 NOTE — ED Notes (Signed)
Patient has +Urine culture. °

## 2012-12-15 ENCOUNTER — Emergency Department (INDEPENDENT_AMBULATORY_CARE_PROVIDER_SITE_OTHER)
Admission: EM | Admit: 2012-12-15 | Discharge: 2012-12-15 | Disposition: A | Discharge status: Home / Self Care | Payer: 59 | Source: Home / Self Care | Attending: Family Medicine | Admitting: Family Medicine

## 2012-12-15 ENCOUNTER — Encounter (HOSPITAL_COMMUNITY): Payer: Self-pay

## 2012-12-15 DIAGNOSIS — J40 Bronchitis, not specified as acute or chronic: Secondary | ICD-10-CM

## 2012-12-15 MED ORDER — ALBUTEROL SULFATE HFA 108 (90 BASE) MCG/ACT IN AERS: 2.0000 | INHALATION_SPRAY | RESPIRATORY_TRACT | Status: DC | PRN

## 2012-12-15 MED ORDER — PREDNISONE 10 MG PO TABS: 50.0000 mg | ORAL_TABLET | Freq: Every day | ORAL | Status: DC

## 2012-12-15 MED ORDER — BENZONATATE 100 MG PO CAPS: 100.0000 mg | ORAL_CAPSULE | Freq: Three times a day (TID) | ORAL | Status: DC

## 2012-12-15 NOTE — ED Notes (Signed)
C/o cough since 9-12, no relief w OTC medication

## 2012-12-15 NOTE — ED Provider Notes (Signed)
CSN: [id number]     Arrival date & time 12/15/12  1410 History   First MD Initiated Contact with Patient 12/15/12 1449     Chief Complaint  Patient presents with  . Cough   (Consider location/radiation/quality/duration/timing/severity/associated sxs/prior Treatment) HPI Comments: Pt with cold sx, cough is most bothersome problem. Pt used her daughter's albuterol and it helped her to breath better/cough less  Patient is a 32 y.o. female presenting with URI. The history is provided by the patient.  URI Presenting symptoms: congestion, cough, ear pain, rhinorrhea and sore throat   Presenting symptoms: no fever   Severity:  Moderate Onset quality:  Gradual Duration:  3 days Timing:  Constant Progression:  Unchanged Chronicity:  New Relieved by:  Nothing Worsened by:  Nothing tried Ineffective treatments:  OTC medications Associated symptoms: no wheezing     Past Medical History  Diagnosis Date  . Anemia   . Abdominal pain   . Hernia   . Pyelonephritis   . Gestational diabetes    Past Surgical History  Procedure Laterality Date  . Appendectomy  08/1999  . Hernia repair  08/2008    lap supraumb [facility]  . Tubal ligation  05/26/2011    Procedure: POST PARTUM TUBAL LIGATION;  Surgeon: Esmeralda Arthur, MD;  Location: [facility];  Service: Gynecology;  Laterality: Bilateral;   Family History  Problem Relation Age of Onset  . Hypertension Mother   . Anesthesia problems Neg Hx   . Alcohol abuse Father   . Birth defects Father     polydactyly  . Birth defects Sister     polydactyly  . Stroke Maternal Aunt   . Kidney disease Maternal Aunt   . Diabetes Maternal Aunt    History  Substance Use Topics  . Smoking status: Never Smoker   . Smokeless tobacco: Never Used  . Alcohol Use: No   OB History   Grav Para Term Preterm Abortions TAB SAB Ect Mult Living   4 2 1 1 2 1 1  0 1 3     Review of Systems  Constitutional: Negative for fever and chills.  HENT: Positive for ear pain,  congestion, sore throat and rhinorrhea.   Respiratory: Positive for cough. Negative for shortness of breath and wheezing.     Allergies  Vicodin  Home Medications   Current Outpatient Rx  Name  Route  Sig  Dispense  Refill  . albuterol (PROVENTIL HFA;VENTOLIN HFA) 108 (90 BASE) MCG/ACT inhaler   Inhalation   Inhale 2 puffs into the lungs every 4 (four) hours as needed for wheezing.   1 Inhaler   0     Please dispense with spacer and instructions on ho ...   . benzonatate (TESSALON) 100 MG capsule   Oral   Take 1 capsule (100 mg total) by mouth every 8 (eight) hours.   21 capsule   0   . Fe Cbn-Fe Gluc-FA-B12-C-DSS (FERRALET 90) 90-1 MG TABS   Oral   Take 1 tablet by mouth daily.   60 each   2   . predniSONE (DELTASONE) 10 MG tablet   Oral   Take 5 tablets (50 mg total) by mouth daily.   15 tablet   0    BP 120/67  Pulse 85  Temp(Src) 99 F (37.2 C) (Oral)  Resp 24  SpO2 100% Physical Exam  Constitutional: She appears well-developed and well-nourished. She appears ill. No distress.  HENT:  Right Ear: Tympanic membrane, external ear and ear  canal normal.  Left Ear: Tympanic membrane, external ear and ear canal normal.  Nose: Mucosal edema present. Right sinus exhibits no maxillary sinus tenderness and no frontal sinus tenderness. Left sinus exhibits no maxillary sinus tenderness and no frontal sinus tenderness.  Mouth/Throat: Uvula is midline, oropharynx is clear and moist and mucous membranes are normal.  Cardiovascular: Normal rate and regular rhythm.   Pulmonary/Chest: Effort normal and breath sounds normal.  Frequent coughing    ED Course  Procedures (including critical care time) Labs Review Labs Reviewed - No data to display Imaging Review No results found.  MDM   1. Bronchitis   rx albuterol inhaler with spacer to use q4 hours prn cough/wheeze/sob, rx tessalon perles 100mg  TID #21, rx prednisone 50mg  daily for 3 days.      Cathlyn Parsons,  NP 12/15/12 [phone]

## 2012-12-15 NOTE — ED Provider Notes (Signed)
Medical screening examination/treatment/procedure(s) were performed by resident physician or non-physician practitioner and as supervising physician I was immediately available for consultation/collaboration.   Barkley Bruns MD.   Linna Hoff, MD 12/15/12 [phone]

## 2013-01-13 ENCOUNTER — Other Ambulatory Visit: Payer: Self-pay

## 2013-03-02 DIAGNOSIS — M94261 Chondromalacia, right knee: Secondary | ICD-10-CM

## 2013-03-02 HISTORY — DX: Chondromalacia, right knee: M94.261

## 2013-03-17 ENCOUNTER — Encounter (HOSPITAL_BASED_OUTPATIENT_CLINIC_OR_DEPARTMENT_OTHER): Payer: Self-pay | Admitting: *Deleted

## 2013-03-23 ENCOUNTER — Encounter (HOSPITAL_BASED_OUTPATIENT_CLINIC_OR_DEPARTMENT_OTHER)
Admission: RE | Disposition: A | Discharge status: Home / Self Care | Payer: Self-pay | Source: Ambulatory Visit | Attending: Orthopedic Surgery

## 2013-03-23 ENCOUNTER — Ambulatory Visit (HOSPITAL_BASED_OUTPATIENT_CLINIC_OR_DEPARTMENT_OTHER): Payer: 59 | Admitting: Anesthesiology

## 2013-03-23 ENCOUNTER — Encounter (HOSPITAL_BASED_OUTPATIENT_CLINIC_OR_DEPARTMENT_OTHER): Payer: 59 | Admitting: Anesthesiology

## 2013-03-23 ENCOUNTER — Encounter (HOSPITAL_BASED_OUTPATIENT_CLINIC_OR_DEPARTMENT_OTHER): Payer: Self-pay | Admitting: *Deleted

## 2013-03-23 ENCOUNTER — Ambulatory Visit (HOSPITAL_BASED_OUTPATIENT_CLINIC_OR_DEPARTMENT_OTHER)
Admission: RE | Admit: 2013-03-23 | Discharge: 2013-03-23 | Disposition: A | Discharge status: Home / Self Care | Payer: 59 | Source: Ambulatory Visit | Attending: Orthopedic Surgery | Admitting: Orthopedic Surgery

## 2013-03-23 DIAGNOSIS — M6750 Plica syndrome, unspecified knee: Secondary | ICD-10-CM | POA: Insufficient documentation

## 2013-03-23 DIAGNOSIS — M2240 Chondromalacia patellae, unspecified knee: Secondary | ICD-10-CM | POA: Insufficient documentation

## 2013-03-23 HISTORY — DX: Chondromalacia, right knee: M94.261

## 2013-03-23 HISTORY — DX: Personal history of other diseases of urinary system: Z87.448

## 2013-03-23 HISTORY — PX: KNEE ARTHROSCOPY: SHX127

## 2013-03-23 SURGERY — ARTHROSCOPY, KNEE
Anesthesia: General | Site: Knee | Laterality: Right

## 2013-03-23 MED ORDER — FENTANYL CITRATE 0.05 MG/ML IJ SOLN
INTRAMUSCULAR | Status: AC
  Filled 2013-03-23: qty 4

## 2013-03-23 MED ORDER — LIDOCAINE HCL (CARDIAC) 20 MG/ML IV SOLN
INTRAVENOUS | Status: DC | PRN
  Administered 2013-03-23: 50 mg via INTRAVENOUS

## 2013-03-23 MED ORDER — HYDROMORPHONE HCL PF 1 MG/ML IJ SOLN
INTRAMUSCULAR | Status: AC
  Filled 2013-03-23: qty 1

## 2013-03-23 MED ORDER — DEXAMETHASONE SODIUM PHOSPHATE 4 MG/ML IJ SOLN
INTRAMUSCULAR | Status: DC | PRN
  Administered 2013-03-23: 10 mg via INTRAVENOUS

## 2013-03-23 MED ORDER — OXYCODONE HCL 5 MG PO TABS
5.0000 mg | ORAL_TABLET | Freq: Once | ORAL | Status: AC
  Administered 2013-03-23: 5 mg via ORAL

## 2013-03-23 MED ORDER — CEFAZOLIN SODIUM 1-5 GM-% IV SOLN
1.0000 g | Freq: Once | INTRAVENOUS | Status: AC
  Administered 2013-03-23: 2 g via INTRAVENOUS

## 2013-03-23 MED ORDER — FENTANYL CITRATE 0.05 MG/ML IJ SOLN
INTRAMUSCULAR | Status: DC | PRN
  Administered 2013-03-23: 25 ug via INTRAVENOUS
  Administered 2013-03-23: 50 ug via INTRAVENOUS
  Administered 2013-03-23: 25 ug via INTRAVENOUS

## 2013-03-23 MED ORDER — CEFAZOLIN SODIUM-DEXTROSE 2-3 GM-% IV SOLR
INTRAVENOUS | Status: AC
  Filled 2013-03-23: qty 50

## 2013-03-23 MED ORDER — MIDAZOLAM HCL 2 MG/ML PO SYRP: 12.0000 mg | ORAL_SOLUTION | Freq: Once | ORAL | Status: DC | PRN

## 2013-03-23 MED ORDER — OXYCODONE-ACETAMINOPHEN 5-325 MG PO TABS: 1.0000 | ORAL_TABLET | Freq: Four times a day (QID) | ORAL | Status: DC | PRN

## 2013-03-23 MED ORDER — PROPOFOL 10 MG/ML IV BOLUS
INTRAVENOUS | Status: DC | PRN
  Administered 2013-03-23: 200 mg via INTRAVENOUS

## 2013-03-23 MED ORDER — ONDANSETRON HCL 4 MG/2ML IJ SOLN
INTRAMUSCULAR | Status: DC | PRN
  Administered 2013-03-23: 4 mg via INTRAVENOUS

## 2013-03-23 MED ORDER — BUPIVACAINE HCL (PF) 0.5 % IJ SOLN
INTRAMUSCULAR | Status: AC
  Filled 2013-03-23: qty 30

## 2013-03-23 MED ORDER — LACTATED RINGERS IV SOLN
INTRAVENOUS | Status: DC
  Administered 2013-03-23: 10:00:00 via INTRAVENOUS

## 2013-03-23 MED ORDER — MIDAZOLAM HCL 5 MG/5ML IJ SOLN
INTRAMUSCULAR | Status: DC | PRN
  Administered 2013-03-23: 2 mg via INTRAVENOUS

## 2013-03-23 MED ORDER — OXYCODONE HCL 5 MG PO TABS
ORAL_TABLET | ORAL | Status: AC
  Filled 2013-03-23: qty 1

## 2013-03-23 MED ORDER — BUPIVACAINE HCL (PF) 0.5 % IJ SOLN
INTRAMUSCULAR | Status: DC | PRN
  Administered 2013-03-23: 20 mL

## 2013-03-23 MED ORDER — MIDAZOLAM HCL 2 MG/2ML IJ SOLN: 1.0000 mg | INTRAMUSCULAR | Status: DC | PRN

## 2013-03-23 MED ORDER — BUPIVACAINE HCL (PF) 0.25 % IJ SOLN
INTRAMUSCULAR | Status: AC
  Filled 2013-03-23: qty 30

## 2013-03-23 MED ORDER — SODIUM CHLORIDE 0.9 % IR SOLN
Status: DC | PRN
  Administered 2013-03-23: 4000 mL

## 2013-03-23 MED ORDER — HYDROMORPHONE HCL PF 1 MG/ML IJ SOLN
0.2500 mg | INTRAMUSCULAR | Status: DC | PRN
  Administered 2013-03-23: 0.5 mg via INTRAVENOUS
  Administered 2013-03-23: 0.25 mg via INTRAVENOUS

## 2013-03-23 MED ORDER — MIDAZOLAM HCL 2 MG/2ML IJ SOLN
INTRAMUSCULAR | Status: AC
  Filled 2013-03-23: qty 2

## 2013-03-23 MED ORDER — FENTANYL CITRATE 0.05 MG/ML IJ SOLN: 50.0000 ug | INTRAMUSCULAR | Status: DC | PRN

## 2013-03-23 SURGICAL SUPPLY — 40 items
BANDAGE ELASTIC 6 VELCRO ST LF (GAUZE/BANDAGES/DRESSINGS) ×2 IMPLANT
BLADE 4.2CUDA (BLADE) IMPLANT
BLADE GREAT WHITE 4.2 (BLADE) ×2 IMPLANT
CANISTER SUCT 3000ML (MISCELLANEOUS) IMPLANT
CANISTER SUCT LVC 12 LTR MEDI- (MISCELLANEOUS) ×1 IMPLANT
CUTTER MENISCUS  4.2MM (BLADE)
CUTTER MENISCUS 4.2MM (BLADE) IMPLANT
DRAPE ARTHROSCOPY W/POUCH 114 (DRAPES) ×2 IMPLANT
DRSG EMULSION OIL 3X3 NADH (GAUZE/BANDAGES/DRESSINGS) ×2 IMPLANT
DURAPREP 26ML APPLICATOR (WOUND CARE) ×2 IMPLANT
ELECT MENISCUS 165MM 90D (ELECTRODE) ×1 IMPLANT
ELECT REM PT RETURN 9FT ADLT (ELECTROSURGICAL) ×2
ELECTRODE REM PT RTRN 9FT ADLT (ELECTROSURGICAL) IMPLANT
GLOVE BIOGEL M STRL SZ7.5 (GLOVE) ×1 IMPLANT
GLOVE BIOGEL PI IND STRL 6.5 (GLOVE) IMPLANT
GLOVE BIOGEL PI IND STRL 8 (GLOVE) ×2 IMPLANT
GLOVE BIOGEL PI INDICATOR 6.5 (GLOVE) ×1
GLOVE BIOGEL PI INDICATOR 8 (GLOVE) ×1
GLOVE ECLIPSE 6.5 STRL STRAW (GLOVE) ×1 IMPLANT
GLOVE ECLIPSE 7.5 STRL STRAW (GLOVE) ×3 IMPLANT
GOWN BRE IMP PREV XXLGXLNG (GOWN DISPOSABLE) ×2 IMPLANT
GOWN PREVENTION PLUS XLARGE (GOWN DISPOSABLE) ×2 IMPLANT
GOWN PREVENTION PLUS XXLARGE (GOWN DISPOSABLE) ×3 IMPLANT
HOLDER KNEE FOAM BLUE (MISCELLANEOUS) ×2 IMPLANT
KNEE WRAP E Z 3 GEL PACK (MISCELLANEOUS) ×2 IMPLANT
NDL SAFETY ECLIPSE 18X1.5 (NEEDLE) IMPLANT
NEEDLE HYPO 18GX1.5 SHARP (NEEDLE)
PACK ARTHROSCOPY DSU (CUSTOM PROCEDURE TRAY) ×2 IMPLANT
PACK BASIN DAY SURGERY FS (CUSTOM PROCEDURE TRAY) ×2 IMPLANT
PAD CAST 4YDX4 CTTN HI CHSV (CAST SUPPLIES) ×1 IMPLANT
PADDING CAST COTTON 4X4 STRL (CAST SUPPLIES) ×2
PENCIL BUTTON HOLSTER BLD 10FT (ELECTRODE) IMPLANT
SET ARTHROSCOPY TUBING (MISCELLANEOUS) ×2
SET ARTHROSCOPY TUBING LN (MISCELLANEOUS) ×1 IMPLANT
SPONGE GAUZE 4X4 12PLY (GAUZE/BANDAGES/DRESSINGS) ×2 IMPLANT
SUT ETHILON 4 0 PS 2 18 (SUTURE) ×1 IMPLANT
SYR 5ML LL (SYRINGE) ×2 IMPLANT
TOWEL OR 17X24 6PK STRL BLUE (TOWEL DISPOSABLE) ×2 IMPLANT
TOWEL OR NON WOVEN STRL DISP B (DISPOSABLE) ×2 IMPLANT
WATER STERILE IRR 1000ML POUR (IV SOLUTION) ×2 IMPLANT

## 2013-03-23 NOTE — H&P (Signed)
  PREOPERATIVE H&P  Chief Complaint: r knee pain  HPI: Jessica [name] is a 32 y.o. female who presents for evaluation of r knee pain. It has been present for greater than 6 months and has been worsening. She has failed conservative measures. Pain is rated as moderate.  Past Medical History  Diagnosis Date  . History of pyelonephritis   . 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 Appendectomy  08/1999  . Tubal ligation  05/26/2011    Procedure: POST PARTUM TUBAL LIGATION;  Surgeon: Esmeralda Arthur, MD;  Location: [facility];  Service: Gynecology;  Laterality: Bilateral;  . Hernia repair  08/2008    supraumbilical   History   Social History  . Marital Status: Married    Spouse Name: N/A    Number of Children: N/A  . Years of Education: N/A   Social History Main Topics  . Smoking status: Never Smoker   . Smokeless tobacco: Never Used  . Alcohol Use: No  . Drug Use: No  . Sexual Activity: Yes    Birth Control/ Protection: Surgical     Comment: last intercourse 2-3 wks ago   Other Topics Concern  . None   Social History Narrative  . None   Family History  Problem Relation Age of Onset  . Hypertension Mother   . Diabetes Maternal Aunt   . Alcohol abuse Father   . Birth defects Father     polydactyly  . Birth defects Sister     polydactyly  . Stroke Maternal Aunt   . Kidney disease Maternal Aunt    No Known Allergies Prior to Admission medications   Medication Sig Start Date End Date Taking? Authorizing Provider  cephALEXin (KEFLEX) 500 MG capsule Take 500 mg by mouth 2 (two) times a week.   Yes Historical Provider, MD  traMADol (ULTRAM) 50 MG tablet Take by mouth every 6 (six) hours as needed.   Yes Historical Provider, MD     Positive ROS: none  All other systems have been reviewed and were otherwise negative with the exception of those mentioned in the HPI and as above.  Physical Exam: Filed Vitals:   03/23/13 0921  BP: 102/58  Pulse: 78  Temp: 97.9 F (36.6 C)  Resp: 18    General: Alert, no acute distress Cardiovascular: No pedal edema Respiratory: No cyanosis, no use of accessory musculature GI: No organomegaly, abdomen is soft and non-tender Skin: No lesions in the area of chief complaint Neurologic: Sensation intact distally Psychiatric: Patient is competent for consent with normal mood and affect Lymphatic: No axillary or cervical lymphadenopathy  MUSCULOSKELETAL: r knee: + compression and inhibition// mild jt line tender  Assessment/Plan: Chondromalacia right knee Plan for Procedure(s): ARTHROSCOPY RIGHT KNEE possible lateral release  The risks benefits and alternatives were discussed with the patient including but not limited to the risks of nonoperative treatment, versus surgical intervention including infection, bleeding, nerve injury, malunion, nonunion, hardware prominence, hardware failure, need for hardware removal, blood clots, cardiopulmonary complications, morbidity, mortality, among others, and they were willing to proceed.  Predicted outcome is good, although there will be at least a six to nine month expected recovery.  Avo Schlachter L, MD 03/23/2013 11:20 AM

## 2013-03-23 NOTE — Anesthesia Postprocedure Evaluation (Signed)
Anesthesia Post Note  Patient: Jessica [name]  Procedure(s) Performed: Procedure(s) (LRB): ARTHROSCOPY RIGHT KNEE (Right)  Anesthesia type: general  Patient location: PACU  Post pain: Pain level controlled  Post assessment: Patient's Cardiovascular Status Stable  Last Vitals:  Filed Vitals:   03/23/13 1300  BP: 120/73  Pulse: 70  Temp:   Resp: 13    Post vital signs: Reviewed and stable  Level of consciousness: sedated  Complications: No apparent anesthesia complications

## 2013-03-23 NOTE — Transfer of Care (Signed)
Immediate Anesthesia Transfer of Care Note  Patient: Jessica [name]  Procedure(s) Performed: Procedure(s) with comments: ARTHROSCOPY RIGHT KNEE (Right) - Chondroplasty with fermoral lateral release and medial and lateral plica excision  Patient Location: PACU  Anesthesia Type:General  Level of Consciousness: awake and sedated  Airway & Oxygen Therapy: Patient Spontanous Breathing and Patient connected to face mask oxygen  Post-op Assessment: Report given to PACU RN and Post -op Vital signs reviewed and stable  Post vital signs: Reviewed and stable  Complications: No apparent anesthesia complications

## 2013-03-23 NOTE — Brief Op Note (Signed)
03/23/2013  12:21 PM  PATIENT:  Jessica [name]  32 y.o. female  PRE-OPERATIVE DIAGNOSIS:  Chondromalacia right knee  POST-OPERATIVE DIAGNOSIS:  Chondromalacia right knee  PROCEDURE:  Procedure(s) with comments: ARTHROSCOPY RIGHT KNEE (Right) - Chondroplasty with fermoral lateral release and medial and lateral plica excision  SURGEON:  Surgeon(s) and Role:    * Harvie Junior, MD - Primary  PHYSICIAN ASSISTANT:   ASSISTANTS: bethune   ANESTHESIA:   general  EBL:  Total I/O In: 700 [I.V.:700] Out: -   BLOOD ADMINISTERED:none  DRAINS: none   LOCAL MEDICATIONS USED:  MARCAINE     SPECIMEN:  No Specimen  DISPOSITION OF SPECIMEN:  N/A  COUNTS:  YES  TOURNIQUET:  * No tourniquets in log *  DICTATION: .Other Dictation: Dictation Number [id number]  PLAN OF CARE: Discharge to home after PACU  PATIENT DISPOSITION:  PACU - hemodynamically stable.   Delay start of Pharmacological VTE agent (>24hrs) due to surgical blood loss or risk of bleeding: no

## 2013-03-23 NOTE — Anesthesia Procedure Notes (Signed)
Procedure Name: LMA Insertion Performed by: Elenna Spratling W Pre-anesthesia Checklist: Patient identified, Timeout performed, Emergency Drugs available, Suction available and Patient being monitored Patient Re-evaluated:Patient Re-evaluated prior to inductionOxygen Delivery Method: Circle system utilized Preoxygenation: Pre-oxygenation with 100% oxygen Intubation Type: IV induction Ventilation: Mask ventilation without difficulty LMA: LMA inserted LMA Size: 4.0 Number of attempts: 1 Placement Confirmation: breath sounds checked- equal and bilateral and positive ETCO2 Tube secured with: Tape Dental Injury: Teeth and Oropharynx as per pre-operative assessment      

## 2013-03-23 NOTE — Anesthesia Preprocedure Evaluation (Signed)
Anesthesia Evaluation  Patient identified by MRN, date of birth, ID band Patient awake    Reviewed: Allergy & Precautions, H&P , NPO status , Patient's Chart, lab work & pertinent test results  Airway Mallampati: II TM Distance: >3 FB Neck ROM: full    Dental  (+) Teeth Intact and Dental Advidsory Given   Pulmonary neg pulmonary ROS,  breath sounds clear to auscultation        Cardiovascular negative cardio ROS  Rhythm:regular Rate:Normal     Neuro/Psych negative neurological ROS  negative psych ROS   GI/Hepatic negative GI ROS, Neg liver ROS,   Endo/Other  negative endocrine ROS  Renal/GU negative Renal ROS     Musculoskeletal   Abdominal   Peds  Hematology   Anesthesia Other Findings   Reproductive/Obstetrics negative OB ROS                          Anesthesia Physical Anesthesia Plan  ASA: I  Anesthesia Plan: General LMA   Post-op Pain Management:    Induction:   Airway Management Planned:   Additional Equipment:   Intra-op Plan:   Post-operative Plan:   Informed Consent: I have reviewed the patients History and Physical, chart, labs and discussed the procedure including the risks, benefits and alternatives for the proposed anesthesia with the patient or authorized representative who has indicated his/her understanding and acceptance.   Dental Advisory Given  Plan Discussed with: Anesthesiologist, CRNA and Surgeon  Anesthesia Plan Comments:        Anesthesia Quick Evaluation  

## 2013-03-24 ENCOUNTER — Encounter (HOSPITAL_BASED_OUTPATIENT_CLINIC_OR_DEPARTMENT_OTHER): Payer: Self-pay | Admitting: Orthopedic Surgery

## 2013-03-24 NOTE — Op Note (Signed)
NAMEMARLANE, [name] NO.:  [id number]  MEDICAL RECORD NO.:  [id number]  LOCATION:                               FACILITY:  [facility]  PHYSICIAN:  Harvie Junior, M.D.   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  PROCEDURE: 1. Arthroscopic chondroplasty of the posterior aspect of the patella     with a 2 x 2 cm area of exposed bone. 2. Arthroscopic lateral retinacular release. 3. Debridement of medial and lateral plica.  SURGEON:  Harvie Junior, M.D.  ASSISTANT:  Marshia Ly, P.A.  ANESTHESIA:  General.  BRIEF HISTORY:  Jessica [name] is a 32 year old female with history of having significant complaints of right knee pain.  She had been treated conservatively for a period of time after failure of all conservative care, she was taken to the operating room for operative knee arthroscopy.  DESCRIPTION OF PROCEDURE:  The patient was taken to the operating room. After adequate anesthesia was obtained with general anesthetic, the patient was placed supine on the operating table.  The right leg was then prepped and draped in usual sterile fashion.  Following this, routine arthroscopic examination of knee revealed there was significant and severe chondromalacia of the patellofemoral joint, in particular the lateral patellar facet, with the lateral patellar facet essentially being denuded down to exposed bone.  This was debrided back to a smooth and stable rim of articular cartilage.  There was fairly dramatic patellar tilt, not as much lateral tracking but dramatic patellar tilt, but given the severity of the chondromalacia, we felt that  lateral retinacular release was appropriate and this was chosen to be done. Attention was turned to the medial compartment where the medial femoral condyle was probed and normal medial meniscus was probed and normal ACL, had little bit of fray of the fibers but certainly was functioning well as an ACL lateral side had normal meniscus and chondral surfaces.  Once the knee had been completely inspected and the only finding was this dramatic chondromalacia of lateral patella facet, we went back up with the camera now on the medial portal and released the lateral retinaculum from 3 fingerbreadths proximal to the patella down to the joint line. It was fairly large medial plica which went around to an anterior plica which was debrided and the lateral plica was debrided as well.  Once this was done, the knee was copiously and thoroughly lavaged and suctioned dry.  All sterile portals were closed with bandage.  Sterile compressive dressing was applied.  The patient was taken to the recovery, was noted to be in satisfactory condition. Estimated blood loss for the procedure was minimal.     Harvie Junior, M.D.   ______________________________ Harvie Junior, M.D.    Ranae Plumber  D:  03/23/2013  T:  03/24/2013  Job:  [id number]

## 2014-02-01 ENCOUNTER — Encounter (HOSPITAL_BASED_OUTPATIENT_CLINIC_OR_DEPARTMENT_OTHER): Payer: Self-pay | Admitting: Orthopedic Surgery

## 2015-06-04 ENCOUNTER — Emergency Department (HOSPITAL_COMMUNITY)
Admission: EM | Admit: 2015-06-04 | Discharge: 2015-06-05 | Disposition: A | Discharge status: Home / Self Care | Payer: BLUE CROSS/BLUE SHIELD | Attending: Emergency Medicine | Admitting: Emergency Medicine

## 2015-06-04 ENCOUNTER — Emergency Department (HOSPITAL_COMMUNITY): Payer: BLUE CROSS/BLUE SHIELD

## 2015-06-04 ENCOUNTER — Encounter (HOSPITAL_COMMUNITY): Payer: Self-pay | Admitting: *Deleted

## 2015-06-04 DIAGNOSIS — Y9301 Activity, walking, marching and hiking: Secondary | ICD-10-CM | POA: Insufficient documentation

## 2015-06-04 DIAGNOSIS — Z87448 Personal history of other diseases of urinary system: Secondary | ICD-10-CM | POA: Diagnosis not present

## 2015-06-04 DIAGNOSIS — S59902A Unspecified injury of left elbow, initial encounter: Secondary | ICD-10-CM | POA: Insufficient documentation

## 2015-06-04 DIAGNOSIS — Z8739 Personal history of other diseases of the musculoskeletal system and connective tissue: Secondary | ICD-10-CM | POA: Diagnosis not present

## 2015-06-04 DIAGNOSIS — W108XXA Fall (on) (from) other stairs and steps, initial encounter: Secondary | ICD-10-CM | POA: Insufficient documentation

## 2015-06-04 DIAGNOSIS — Y9289 Other specified places as the place of occurrence of the external cause: Secondary | ICD-10-CM | POA: Diagnosis not present

## 2015-06-04 DIAGNOSIS — Y998 Other external cause status: Secondary | ICD-10-CM | POA: Diagnosis not present

## 2015-06-04 MED ORDER — OXYCODONE-ACETAMINOPHEN 5-325 MG PO TABS
1.0000 | ORAL_TABLET | Freq: Once | ORAL | Status: AC
  Administered 2015-06-04: 1 via ORAL
  Filled 2015-06-04: qty 1

## 2015-06-04 MED ORDER — TRAMADOL HCL 50 MG PO TABS: 50.0000 mg | ORAL_TABLET | Freq: Four times a day (QID) | ORAL | Status: DC | PRN

## 2015-06-04 NOTE — ED Notes (Signed)
Around 2130 pt states there was a shoe in the stairwell and she fell down approx 6 steps, landed on her back/left side. Pt c/o pain in the back (now subsided) and the left elbow.

## 2015-06-04 NOTE — Discharge Instructions (Signed)
How to Use a Sling °A sling is a type of hanging bandage that is worn around your neck to protect an injured arm, shoulder, or other body part. You may need to wear a sling to keep you from moving (immobilize) the injured body part while it heals. Keeping the injured part of your body still reduces pain and speeds up healing. Your health care provider may recommend using a sling if you have:  °· A broken arm. °· A broken collarbone. °· A shoulder injury. °· Surgery. °RISKS AND COMPLICATIONS °Wearing a sling the wrong way can: °· Make your injury worse. °· Cause stiffness or numbness. °· Affect blood circulation in your arm and hand. This can cause tingling or numbness in your fingers or hands. °HOW TO USE A SLING °The way that you should use a sling depends on your injury. It is important that you follow all of your health care provider's instructions for your injury. Also follow these general guidelines: °· Wear the sling so that your arm bends 90 degrees at the elbow. That is like a right angle or the shape of a capital letter "L." The sling should also support your wrist and your hand. °· Try to avoid moving your arm. °· Do not lie down flat on your back while wearing a sling. Sleep in a recliner or use pillows to raise your upper body in bed. °· Do not twist, raise, or move your arm in a way that could make your injury worse. °· Do not lean on your arm while wearing a sling. °· Do not lift anything while wearing a sling. °SEEK MEDICAL CARE IF: °· You have bruising, swelling, or pain that is getting worse. °· Your pain medicine is not helping. °· You have a fever. °SEEK IMMEDIATE MEDICAL CARE IF: °· Your fingers are numb or tingling. °· Your fingers turn blue or feel cold to the touch. °· You cannot control the bleeding from your injury. °· You are short of breath. °  °This information is not intended to replace advice given to you by your health care provider. Make sure you discuss any questions you have with  your health care provider. °  °Document Released: 11/01/2003 Document Revised: 04/09/2014 Document Reviewed: 01/20/2014 °Elsevier Interactive Patient Education ©2016 [employer] ° °

## 2015-06-04 NOTE — ED Provider Notes (Signed)
CSN: [id number]     Arrival date & time 06/04/15  2224 History  By signing my name below, I, Jessica [name], attest that this documentation has been prepared under the direction and in the presence of Marlon Peliffany Laquita Harlan, PA-C. Electronically Signed: Ronney LionSuzanne [name], ED Scribe. 06/04/2015. 11:51 PM.    Chief Complaint  Patient presents with  . Fall   The history is provided by the patient. No language interpreter was used.    HPI Comments: Jessica [name] is a 35 y.o. female who presents to the Emergency Department S/P a fall that occurred about 1 hour ago, at 9:30 PM, complaining of sudden-onset, constant, 8/10 left elbow pain and 8/10 back pain since falling. Patient states she had tripped on a shoe while walking down the stairs, falling down approximately 6 steps and landing on the carpeted floor. She denies head injury, LOC, or neck pain. Patient states she had taken ibuprofen, with mild relief. She denies shoulder pain, abdominal pain, or chest pain.   Past Medical History  Diagnosis Date  . History of pyelonephritis   . 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 Appendectomy  08/1999  . Tubal ligation  05/26/2011    Procedure: POST PARTUM TUBAL LIGATION;  Surgeon: Esmeralda ArthurSandra A Rivard, MD;  Location: [facility];  Service: Gynecology;  Laterality: Bilateral;  . Hernia repair  08/2008    supraumbilical  . Knee arthroscopy Right 03/23/2013    Procedure: ARTHROSCOPY RIGHT KNEE;  Surgeon: Harvie JuniorJohn L Graves, MD;  Location: [facility];  Service: Orthopedics;  Laterality: Right;  Chondroplasty with fermoral lateral release and medial and lateral plica excision   Family History  Problem Relation Age of Onset  . Hypertension Mother   . Diabetes Maternal Aunt   . Alcohol abuse Father   . Birth defects Father     polydactyly  . Birth defects Sister     polydactyly  . Stroke Maternal Aunt   . Kidney disease Maternal Aunt    Social History  Substance  Use Topics  . Smoking status: Never Smoker   . Smokeless tobacco: Never Used  . Alcohol Use: No   OB History    Gravida Para Term Preterm AB TAB SAB Ectopic Multiple Living   4 2 1 1 2 1 1  0 1 3     Review of Systems A complete 10 system review of systems was obtained and all systems are negative except as noted in the HPI and PMH.    Allergies  Review of patient's allergies indicates no known allergies.  Home Medications   Prior to Admission medications   Medication Sig Start Date End Date Taking? Authorizing Provider  ibuprofen (ADVIL,MOTRIN) 200 MG tablet Take 600 mg by mouth every 6 (six) hours as needed for moderate pain.   Yes Historical Provider, MD  oxyCODONE-acetaminophen (PERCOCET/ROXICET) 5-325 MG per tablet Take 1-2 tablets by mouth every 6 (six) hours as needed for severe pain. Patient not taking: Reported on 06/04/2015 03/23/13   Marshia LyJames Bethune, PA-C  traMADol (ULTRAM) 50 MG tablet Take 1 tablet (50 mg total) by mouth every 6 (six) hours as needed. 06/04/15   Zinia Innocent Neva SeatGreene, PA-C   BP 106/75 mmHg  Pulse 78  Temp(Src) 98.4 F (36.9 C) (Oral)  Resp 16  SpO2 100%  LMP 05/14/2015 Physical Exam  Constitutional: She is oriented to person, place, and time. She appears well-developed and well-nourished. No distress.  HENT:  Head: Normocephalic  and atraumatic.  Eyes: Conjunctivae and EOM are normal.  Neck: Neck supple. No tracheal deviation present.  Cardiovascular: Normal rate.   Pulmonary/Chest: Effort normal. No respiratory distress.  Musculoskeletal:       Left elbow: She exhibits decreased range of motion (due to pain). She exhibits no swelling, no effusion, no deformity and no laceration. Tenderness found. Lateral epicondyle and olecranon process tenderness noted. No radial head and no medial epicondyle tenderness noted.  Physiologic grip strength. NIV  Neurological: She is alert and oriented to person, place, and time.  Skin: Skin is warm and dry.  Psychiatric: She  has a normal mood and affect. Her behavior is normal.  Nursing note and vitals reviewed.   ED Course  Procedures (including critical care time)  DIAGNOSTIC STUDIES: Oxygen Saturation is 100% on RA, normal by my interpretation.    COORDINATION OF CARE: 10:53 PM - Discussed treatment plan with pt at bedside which includes dosage of Percocet administered in the ED. Pt verbalized understanding and agreed to plan.   Imaging Review Dg Elbow Complete Left  06/04/2015  CLINICAL DATA:  Status post fall down 6 steps, with injury to the left elbow. Diffuse left elbow pain. Initial encounter. EXAM: LEFT ELBOW - COMPLETE 3+ VIEW COMPARISON:  None. FINDINGS: There is no evidence of fracture or dislocation. The visualized joint spaces are preserved. No significant joint effusion is identified. The soft tissues are unremarkable in appearance. IMPRESSION: No evidence of fracture or dislocation. Electronically Signed   By: Roanna Raider M.D.   On: 06/04/2015 23:44   I have personally reviewed and evaluated these images and lab results as part of my medical decision-making.  MDM   Final diagnoses:  Elbow injury, left, initial encounter    Patient X-Ray negative for obvious fracture or dislocation.  Pt advised to follow up with orthopedics. Patient given shoulder sling while in ED, conservative therapy recommended and discussed. Patient will be discharged home & is agreeable with above plan. Returns precautions discussed. Pt appears safe for discharge. Discussed with patient that a small occult fracture may not immediately show up on xray therefore if pain persists she will need repeat xray in 1-2 weeks.   I personally performed the services described in this documentation, which was scribed in my presence. The recorded information has been reviewed and is accurate.      Marlon Pel, PA-C 06/05/15 0021  Lyndal Pulley, MD 06/05/15 [id number]

## 2015-06-04 NOTE — ED Notes (Signed)
Pt fell around 2130 down a few stairs and landed on her back and left elbow. Pt has complaints of left back pain and left elbow pain. Pt denies hitting her head or any other body part and denies any LOC

## 2016-05-04 ENCOUNTER — Ambulatory Visit
Admission: RE | Admit: 2016-05-04 | Discharge: 2016-05-04 | Disposition: A | Discharge status: Home / Self Care | Payer: BLUE CROSS/BLUE SHIELD | Source: Ambulatory Visit | Attending: Family | Admitting: Family

## 2016-05-04 ENCOUNTER — Other Ambulatory Visit: Payer: Self-pay | Admitting: Family

## 2016-05-04 DIAGNOSIS — M542 Cervicalgia: Secondary | ICD-10-CM

## 2016-06-21 ENCOUNTER — Encounter: Payer: Self-pay | Admitting: Physician Assistant

## 2016-06-26 ENCOUNTER — Ambulatory Visit (INDEPENDENT_AMBULATORY_CARE_PROVIDER_SITE_OTHER): Payer: BLUE CROSS/BLUE SHIELD | Admitting: Physician Assistant

## 2016-06-26 ENCOUNTER — Encounter: Payer: Self-pay | Admitting: Physician Assistant

## 2016-06-26 ENCOUNTER — Encounter (INDEPENDENT_AMBULATORY_CARE_PROVIDER_SITE_OTHER): Payer: Self-pay

## 2016-06-26 VITALS — BP 110/64 | HR 79 | Ht 63.0 in | Wt 161.0 lb

## 2016-06-26 DIAGNOSIS — K5909 Other constipation: Secondary | ICD-10-CM

## 2016-06-26 MED ORDER — LINACLOTIDE 145 MCG PO CAPS: 145.0000 ug | ORAL_CAPSULE | Freq: Every day | ORAL | 2 refills | Status: DC

## 2016-06-26 NOTE — Patient Instructions (Addendum)
If you are age 38 or older, your body mass index should be between 23-30. Your Body mass index is 28.52 kg/m. If this is out of the aforementioned range listed, please consider follow up with your Primary Care Provider.  If you are age 38 or younger, your body mass index should be between 19-25. Your Body mass index is 28.52 kg/m. If this is out of the aformentioned range listed, please consider follow up with your Primary Care Provider.   We have sent the following medications to your pharmacy for you to pick up at your convenience:  Linzess  Bowel Purge with Clenpiq - please follow instructions given.  Start Align or Culturell daily.  Start high fiber diet.  Drink 60 ounces of water daily.  AVOID NSAIDS and pain medications.  Call back in 7-10 days and speak with Amy's assistant with an update.

## 2016-06-26 NOTE — Progress Notes (Signed)
Subjective:    Patient ID: Jessica [name], female    DOB: Feb 21, 1981, 36 y.o.   MRN: [id number]  HPI  Jessica [name] is a pleasant 36 year old African-American female, new to GI today [facility] for evaluation of chronic constipation. The patient has not had any prior GI evaluation. She is in generally good health. She says her problems started about 5 years ago around the same time that she had given birth to twins. She has had progressive symptoms since then. At this point she will go 2-3 weeks between bowel movements. She has tried mag citrate in the past without much and if it, MiraLAX without benefit, fleets enemas which have been somewhat helpful. She says she eats very well drinks plenty of water and exercises regularly. She has used dieters tea with some success. In between bowel movements she will eventually develop abdominal bloating and discomfort and sometimes vague nausea. He has not seen any melena or hematochezia though says with severe constipation and has noted small amount of blood on the tissue. Family history is negative for colon cancer polyps or inflammatory bowel disease. Med list does list NSAIDs and oxycodone. She had had shoulder injury and says she is no longer taking either of those on a regular basis.  Review of Systems Pertinent positive and negative review of systems were noted in the above HPI section.  All other review of systems was otherwise negative.  Outpatient Encounter Prescriptions as of 06/26/2016  Medication Sig  . ibuprofen (ADVIL,MOTRIN) 200 MG tablet Take 600 mg by mouth every 6 (six) hours as needed for moderate pain.  Marland Kitchen. oxyCODONE-acetaminophen (PERCOCET/ROXICET) 5-325 MG per tablet Take 1-2 tablets by mouth every 6 (six) hours as needed for severe pain.  . traMADol (ULTRAM) 50 MG tablet Take 1 tablet (50 mg total) by mouth every 6 (six) hours as needed.  . linaclotide (LINZESS) 145 MCG CAPS capsule Take 1  capsule (145 mcg total) by mouth daily before breakfast.   No facility-administered encounter medications on file as of 06/26/2016.    No Known Allergies Patient Active Problem List   Diagnosis Date Noted  . Vaginitis 07/10/2011  . Delivery of twins, both live 05/28/2011  . Lactating mother 05/28/2011  . Status post tubal ligation at time of delivery, current hospitalization 05/28/2011  . Anemia 05/24/2011  . Abdominal wall pain in left paramedian 12/20/2010   Social History   Social History  . Marital status: Married    Spouse name: N/A  . Number of children: N/A  . Years of education: N/A   Occupational History  . Not on file.   Social History Main Topics  . Smoking status: Never Smoker  . Smokeless tobacco: Never Used  . Alcohol use No  . Drug use: No  . Sexual activity: Yes    Birth control/ protection: Surgical     Comment: last intercourse 2-3 wks ago   Other Topics Concern  . Not on file   Social History Narrative  . No narrative on file    Ms. Gonterman's family history includes Alcohol abuse in her father; Birth defects in her father and sister; Diabetes in her maternal aunt; Hypertension in her mother; Kidney disease in her maternal aunt; Stroke in her maternal aunt.      Objective:    Vitals:   06/26/16 0845  BP: 110/64  Pulse: 79    Physical Exam  well-developed African-American female in no acute distress, blood pressure 110/64  pulse 79, height 5 foot 3, weight 161, BMI of 28.5. HEENT; nontraumatic normocephalic EOMI PERRLA sclera anicteric, Cardiovascular; regular rate and rhythm with S1-S2 no murmur or gallop, Pulmonary; clear bilaterally, Abdomen ;soft, no focal tenderness, no palpable mass or hepatosplenomegaly bowel sounds are present, Rectal; exam not done, Extremities; no clubbing cyanosis or edema skin warm and dry, Neuropsych; mood and affect appropriate       Assessment & Plan:   #45 36 year old African-American female with chronic  constipation 5 years. 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  Plan; CBC with differential, sedimentation rate CRP and TSH Will give her a bowel purge, with Suprep or Clenpic today. Start trial of Linzess 145 g daily. She was given samples and prescription sent Start align or culture well daily. Have asked her to call back with a progress report in 7-10 days. Patient will be established with Dr. Adela Lank ,and will follow up with Dr. Adela Lank or myself on an as-needed basis.  Chole Driver Oswald Hillock PA-C 06/26/2016   Cc: Alwyn Pea, MD

## 2016-06-27 NOTE — Progress Notes (Signed)
Agree with assessment and plan as outlined.  

## 2017-02-26 DIAGNOSIS — H65191 Other acute nonsuppurative otitis media, right ear: Secondary | ICD-10-CM | POA: Diagnosis not present

## 2017-02-26 DIAGNOSIS — H60311 Diffuse otitis externa, right ear: Secondary | ICD-10-CM | POA: Diagnosis not present

## 2017-02-26 DIAGNOSIS — R51 Headache: Secondary | ICD-10-CM | POA: Diagnosis not present

## 2017-08-15 DIAGNOSIS — Z118 Encounter for screening for other infectious and parasitic diseases: Secondary | ICD-10-CM | POA: Diagnosis not present

## 2017-08-15 DIAGNOSIS — Z1159 Encounter for screening for other viral diseases: Secondary | ICD-10-CM | POA: Diagnosis not present

## 2017-08-15 DIAGNOSIS — Z113 Encounter for screening for infections with a predominantly sexual mode of transmission: Secondary | ICD-10-CM | POA: Diagnosis not present

## 2017-08-15 DIAGNOSIS — A5901 Trichomonal vulvovaginitis: Secondary | ICD-10-CM | POA: Diagnosis not present

## 2017-08-15 DIAGNOSIS — N898 Other specified noninflammatory disorders of vagina: Secondary | ICD-10-CM | POA: Diagnosis not present

## 2017-08-15 DIAGNOSIS — Z114 Encounter for screening for human immunodeficiency virus [HIV]: Secondary | ICD-10-CM | POA: Diagnosis not present

## 2017-10-05 ENCOUNTER — Encounter (HOSPITAL_COMMUNITY): Payer: Self-pay | Admitting: Emergency Medicine

## 2017-10-05 ENCOUNTER — Other Ambulatory Visit: Payer: Self-pay

## 2017-10-05 ENCOUNTER — Emergency Department (HOSPITAL_COMMUNITY)
Admission: EM | Admit: 2017-10-05 | Discharge: 2017-10-06 | Disposition: A | Discharge status: Home / Self Care | Payer: BLUE CROSS/BLUE SHIELD | Attending: Emergency Medicine | Admitting: Emergency Medicine

## 2017-10-05 DIAGNOSIS — M545 Low back pain, unspecified: Secondary | ICD-10-CM

## 2017-10-05 DIAGNOSIS — K5900 Constipation, unspecified: Secondary | ICD-10-CM | POA: Diagnosis not present

## 2017-10-05 DIAGNOSIS — R194 Change in bowel habit: Secondary | ICD-10-CM | POA: Diagnosis not present

## 2017-10-05 LAB — CBC
HEMATOCRIT: 34 % — AB (ref 36.0–46.0)
Hemoglobin: 10.2 g/dL — ABNORMAL LOW (ref 12.0–15.0)
MCH: 22.8 pg — AB (ref 26.0–34.0)
MCHC: 30 g/dL (ref 30.0–36.0)
MCV: 75.9 fL — ABNORMAL LOW (ref 78.0–100.0)
PLATELETS: 416 10*3/uL — AB (ref 150–400)
RBC: 4.48 MIL/uL (ref 3.87–5.11)
RDW: 15 % (ref 11.5–15.5)
WBC: 9.6 10*3/uL (ref 4.0–10.5)

## 2017-10-05 LAB — I-STAT BETA HCG BLOOD, ED (MC, WL, AP ONLY)

## 2017-10-05 LAB — URINALYSIS, ROUTINE W REFLEX MICROSCOPIC
Bilirubin Urine: NEGATIVE
Glucose, UA: NEGATIVE mg/dL
Hgb urine dipstick: NEGATIVE
KETONES UR: 5 mg/dL — AB
Nitrite: NEGATIVE
PH: 5 (ref 5.0–8.0)
Protein, ur: NEGATIVE mg/dL
SPECIFIC GRAVITY, URINE: 1.021 (ref 1.005–1.030)

## 2017-10-05 LAB — COMPREHENSIVE METABOLIC PANEL
ALBUMIN: 4 g/dL (ref 3.5–5.0)
ALT: 13 U/L (ref 0–44)
AST: 19 U/L (ref 15–41)
Alkaline Phosphatase: 37 U/L — ABNORMAL LOW (ref 38–126)
Anion gap: 8 (ref 5–15)
BUN: 8 mg/dL (ref 6–20)
CO2: 23 mmol/L (ref 22–32)
CREATININE: 0.8 mg/dL (ref 0.44–1.00)
Calcium: 9 mg/dL (ref 8.9–10.3)
Chloride: 108 mmol/L (ref 98–111)
GFR calc Af Amer: >60 mL/min (ref >60)
GFR calc non Af Amer: >60 mL/min (ref >60)
GLUCOSE: 99 mg/dL (ref 70–99)
Potassium: 3.3 mmol/L — ABNORMAL LOW (ref 3.5–5.1)
SODIUM: 139 mmol/L (ref 135–145)
Total Bilirubin: 0.9 mg/dL (ref 0.3–1.2)
Total Protein: 7.5 g/dL (ref 6.5–8.1)

## 2017-10-05 LAB — LIPASE, BLOOD: Lipase: 32 U/L (ref 11–51)

## 2017-10-05 NOTE — ED Triage Notes (Signed)
Pt states she has lower back pain radiating to both legs. Painful to walk. Feels it started earlier this week and has worsened.  No urinary symptoms.  Has not had a bowel movement in 2 weeks. Also endorses left-sided headache.

## 2017-10-06 ENCOUNTER — Emergency Department (HOSPITAL_COMMUNITY): Payer: BLUE CROSS/BLUE SHIELD

## 2017-10-06 DIAGNOSIS — R194 Change in bowel habit: Secondary | ICD-10-CM | POA: Diagnosis not present

## 2017-10-06 LAB — POC OCCULT BLOOD, ED: Fecal Occult Bld: NEGATIVE

## 2017-10-06 MED ORDER — METHOCARBAMOL 500 MG PO TABS: 500.0000 mg | ORAL_TABLET | Freq: Two times a day (BID) | ORAL | 0 refills | Status: DC

## 2017-10-06 MED ORDER — ONDANSETRON HCL 4 MG/2ML IJ SOLN
4.0000 mg | Freq: Once | INTRAMUSCULAR | Status: AC
  Administered 2017-10-06: 4 mg via INTRAVENOUS
  Filled 2017-10-06: qty 2

## 2017-10-06 MED ORDER — KETOROLAC TROMETHAMINE 30 MG/ML IJ SOLN
30.0000 mg | Freq: Once | INTRAMUSCULAR | Status: AC
  Administered 2017-10-06: 30 mg via INTRAVENOUS
  Filled 2017-10-06: qty 1

## 2017-10-06 MED ORDER — NAPROXEN 500 MG PO TABS: 500.0000 mg | ORAL_TABLET | Freq: Two times a day (BID) | ORAL | 0 refills | Status: DC

## 2017-10-06 MED ORDER — MAGNESIUM CITRATE PO SOLN
1.0000 | Freq: Once | ORAL | Status: AC
  Administered 2017-10-06: 1 via ORAL
  Filled 2017-10-06: qty 296

## 2017-10-06 MED ORDER — SODIUM CHLORIDE 0.9 % IV BOLUS
1000.0000 mL | Freq: Once | INTRAVENOUS | Status: AC
  Administered 2017-10-06: 1000 mL via INTRAVENOUS

## 2017-10-06 MED ORDER — IOHEXOL 300 MG/ML  SOLN
100.0000 mL | Freq: Once | INTRAMUSCULAR | Status: AC | PRN
  Administered 2017-10-06: 100 mL via INTRAVENOUS

## 2017-10-06 MED ORDER — POLYETHYLENE GLYCOL 3350 17 G PO PACK: 17.0000 g | PACK | Freq: Every day | ORAL | 0 refills | Status: DC

## 2017-10-06 NOTE — Discharge Instructions (Addendum)
There is no evidence of spinal cord problem.  Take the medications for constipation as prescribed.  Follow-up with your GI doctor.  Return to the ED if you have worsening pain, weakness, numbness, incontinence, fever or any other concerns.

## 2017-10-06 NOTE — ED Notes (Addendum)
Pt gave understanding of discharge paperwork, will get medication from pharmacy and will go to follow ups. Pt ambulated from ed to car with no difficulties at this time.

## 2017-10-06 NOTE — ED Notes (Signed)
Pt tolerating fluids very well at this time.

## 2017-10-06 NOTE — ED Provider Notes (Signed)
[facility] Provider Note   CSN: [id number] Arrival date & time: 10/05/17  2119     History   Chief Complaint Chief Complaint  Patient presents with  . Back Pain  . Constipation    HPI Jessica [name] is a 37 y.o. [name].  Patient with history of chronic constipation presenting with 1 week of low back pain that radiates down both legs and is causing her pain with walking.  She states she has not had a bowel movement in 2 weeks which is not unusual for her.  She takes green tea at home to help with her constipation.  States she is still passing gas.  Has not had any nausea or vomiting.  She still has a good appetite and no fever.  There is no weakness in her back or legs.  No numbness or tingling.  No bowel or bladder incontinence.  No fever.  No history of IV drug abuse or cancer.  She sees gastroenterology for her constipation and has required a "cleanout" before.  She is never had any issues with her back or back pain from her constipation. States she is no longer on chronic oxycodone which is still on her med list.   The history is provided by the patient.  Back Pain   Pertinent negatives include no chest pain, no fever, no headaches, no abdominal pain, no dysuria and no weakness.  Constipation   Pertinent negatives include no abdominal pain and no dysuria.    Past Medical History:  Diagnosis Date  . Appendicitis   . 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 History of pyelonephritis     Patient Active Problem List   Diagnosis Date Noted  . Vaginitis 07/10/2011  . Delivery of twins, both live 05/28/2011  . Lactating mother 05/28/2011  . Status post tubal ligation at time of delivery, current hospitalization 05/28/2011  . Anemia 05/24/2011  . Abdominal wall pain in left paramedian 12/20/2010    Past Surgical History:  Procedure Laterality Date  . APPENDECTOMY  08/1999  . HERNIA REPAIR  08/2008   supraumbilical  . KNEE  ARTHROSCOPY Right 03/23/2013   Procedure: ARTHROSCOPY RIGHT KNEE;  Surgeon: Harvie JuniorJohn L Graves, MD;  Location: [facility];  Service: Orthopedics;  Laterality: Right;  Chondroplasty with fermoral lateral release and medial and lateral plica excision  . TUBAL LIGATION  05/26/2011   Procedure: POST PARTUM TUBAL LIGATION;  Surgeon: Esmeralda ArthurSandra A Rivard, MD;  Location: [facility];  Service: Gynecology;  Laterality: Bilateral;     OB History    Gravida  4   Para  2   Term  1   Preterm  1   AB  2   Living  3     SAB  1   TAB  1   Ectopic  0   Multiple  1   Live Births  3            Home Medications    Prior to Admission medications   Medication Sig Start Date End Date Taking? Authorizing Provider  ibuprofen (ADVIL,MOTRIN) 200 MG tablet Take 600 mg by mouth every 6 (six) hours as needed for moderate pain.    [provider]  linaclotide Karlene Einstein(LINZESS) 145 MCG CAPS capsule Take 1 capsule (145 mcg total) by mouth daily before breakfast. 06/26/16   Esterwood, Amy S, PA-C  oxyCODONE-acetaminophen (PERCOCET/ROXICET) 5-325 MG per tablet Take 1-2 tablets by mouth every 6 (six) hours as needed  for severe pain. 03/23/13   Marshia Ly, PA-C  traMADol (ULTRAM) 50 MG tablet Take 1 tablet (50 mg total) by mouth every 6 (six) hours as needed. 06/04/15   Marlon Pel, PA-C    Family History Family History  Problem Relation Age of Onset  . Hypertension Mother   . Alcohol abuse Father   . Birth defects Father        polydactyly  . Diabetes Maternal Aunt   . Birth defects Sister        polydactyly  . Stroke Maternal Aunt   . Kidney disease Maternal Aunt     Social History Social History   Tobacco Use  . Smoking status: Never Smoker  . Smokeless tobacco: Never Used  Substance Use Topics  . Alcohol use: No  . Drug use: No     Allergies   Hydrocodone-acetaminophen   Review of Systems Review of Systems  Constitutional: Negative for activity change, appetite  change and fever.  HENT: Negative for congestion and rhinorrhea.   Respiratory: Negative for cough, chest tightness and shortness of breath.   Cardiovascular: Negative for chest pain.  Gastrointestinal: Positive for constipation. Negative for abdominal pain, nausea and vomiting.  Genitourinary: Negative for dysuria, hematuria, vaginal bleeding and vaginal discharge.  Musculoskeletal: Positive for back pain.  Skin: Negative for rash.  Neurological: Negative for dizziness, weakness and headaches.    all other systems are negative except as noted in the HPI and PMH.    Physical Exam Updated Vital Signs BP 118/70 (BP Location: Right Arm)   Pulse 87   Temp 98.4 F (36.9 C) (Oral)   Resp 18   SpO2 100%   Physical Exam  Constitutional: She is oriented to person, place, and time. She appears well-developed and well-nourished. No distress.  Sleeping comfortably.  HENT:  Head: Normocephalic and atraumatic.  Mouth/Throat: Oropharynx is clear and moist. No oropharyngeal exudate.  Eyes: Pupils are equal, round, and reactive to light. Conjunctivae and EOM are normal.  Neck: Normal range of motion. Neck supple.  No meningismus.  Cardiovascular: Normal rate, regular rhythm, normal heart sounds and intact distal pulses.  No murmur heard. Pulmonary/Chest: Effort normal and breath sounds normal. No respiratory distress. She exhibits no tenderness.  Abdominal: Soft. There is no tenderness. There is no rebound and no guarding.  Genitourinary:  Genitourinary Comments: Chaperone present, no fecal impaction No gross blood. Rectal tone normal.  Musculoskeletal: Normal range of motion. She exhibits no edema or tenderness.  Paraspinal lumbar tenderness, no midline tenderness 5/5 strength in bilateral lower extremities. Ankle plantar and dorsiflexion intact. Great toe extension intact bilaterally. +2 DP and PT pulses. +2 patellar reflexes bilaterally. Normal gait.   Neurological: She is alert and  oriented to person, place, and time. No cranial nerve deficit. She exhibits normal muscle tone. Coordination normal.   5/5 strength throughout. CN 2-12 intact.Equal grip strength.   Skin: Skin is warm. Capillary refill takes less than 2 seconds.  Psychiatric: She has a normal mood and affect. Her behavior is normal.  Nursing note and vitals reviewed.    ED Treatments / Results  Labs (all labs ordered are listed, but only abnormal results are displayed) Labs Reviewed  COMPREHENSIVE METABOLIC PANEL - Abnormal; Notable for the following components:      Result Value   Potassium 3.3 (*)    Alkaline Phosphatase 37 (*)    All other components within normal limits  CBC - Abnormal; Notable for the following components:   Hemoglobin  10.2 (*)    HCT 34.0 (*)    MCV 75.9 (*)    MCH 22.8 (*)    Platelets 416 (*)    All other components within normal limits  URINALYSIS, ROUTINE W REFLEX MICROSCOPIC - Abnormal; Notable for the following components:   APPearance HAZY (*)    Ketones, ur 5 (*)    Leukocytes, UA TRACE (*)    Bacteria, UA RARE (*)    All other components within normal limits  LIPASE, BLOOD  I-STAT BETA HCG BLOOD, ED ([facility], [facility], [facility] ONLY)    EKG None  Radiology Ct Abdomen Pelvis W Contrast  Result Date: 10/06/2017 CLINICAL DATA:  Low back pain radiating to the legs. Painful to walk. Worsening over the last week. No bowel movements in 2 weeks. EXAM: CT ABDOMEN AND PELVIS WITH CONTRAST TECHNIQUE: Multidetector CT imaging of the abdomen and pelvis was performed using the standard protocol following bolus administration of intravenous contrast. CONTRAST:  OMNIPAQUE IOHEXOL 300 MG/ML  SOLN COMPARISON:  03/09/2013 FINDINGS: Lower chest: Mild dependent changes in the lung bases. Hepatobiliary: No focal liver abnormality is seen. No gallstones, gallbladder wall thickening, or biliary dilatation. Pancreas: Unremarkable. No pancreatic ductal dilatation or surrounding inflammatory changes.  Spleen: Normal in size without focal abnormality. Adrenals/Urinary Tract: Adrenal glands are unremarkable. Kidneys are normal, without renal calculi, focal lesion, or hydronephrosis. Bladder is unremarkable. Stomach/Bowel: Stomach, small bowel, and colon are not abnormally distended. No wall thickening or inflammatory changes are appreciated. Appendix appears surgically absent. Vascular/Lymphatic: No significant vascular findings are present. No enlarged abdominal or pelvic lymph nodes. Reproductive: Uterus and bilateral adnexa are unremarkable. Probable involuting cyst in the right ovary. Other: No free air or free fluid in the abdomen. Postoperative changes in the anterior abdominal wall consistent with mesh hernia repair. Musculoskeletal: No acute or significant osseous findings. IMPRESSION: No acute process demonstrated in the abdomen or pelvis. Postoperative changes in the anterior abdominal wall. Electronically Signed   By: Burman Nieves M.D.   On: 10/06/2017 04:23    Procedures Procedures (including critical care time)  Medications Ordered in ED Medications  ondansetron (ZOFRAN) injection 4 mg (4 mg Intravenous Given 10/06/17 0209)  ketorolac (TORADOL) 30 MG/ML injection 30 mg (30 mg Intravenous Given 10/06/17 0209)  sodium chloride 0.9 % bolus 1,000 mL (0 mLs Intravenous Stopped 10/06/17 0326)  iohexol (OMNIPAQUE) 300 MG/ML solution 100 mL (100 mLs Intravenous Contrast Given 10/06/17 0358)     Initial Impression / Assessment and Plan / ED Course  I have reviewed the triage vital signs and the nursing notes.  Pertinent labs & imaging results that were available during my care of the patient were reviewed by me and considered in my medical decision making (see chart for details).    1 week of back pain with 2 weeks of constipation.  Contrary to triage note, denies abdominal pain.  No vomiting.  Neuro vascularly intact with normal strength, sensation and pulses in lower extremities.  Doubt  cord compression or cauda equina. Able to urinate. UA negative.   No fecal impaction on exam. Imaging without SBO or other acute pathology. Ambulatory and tolerating PO.  Discussed avoiding narcotics as they will make constipation worse. Advised miralax and magnesium citrate and followup with GI. She has used miralax intermittently in the past but not a regular basis. Unclear whether back pain related to constipation but possible. Will treat with antiinflammatories and muscle relaxers and PCP followup. Return precautions discussed.   Final Clinical Impressions(s) /  ED Diagnoses   Final diagnoses:  Acute bilateral low back pain without sciatica  Constipation, unspecified constipation type    ED Discharge Orders    None       Dorie Ohms, Jeannett Senior, MD 10/06/17 1714

## 2018-01-16 DIAGNOSIS — M25461 Effusion, right knee: Secondary | ICD-10-CM | POA: Diagnosis not present

## 2018-01-29 ENCOUNTER — Other Ambulatory Visit: Payer: Self-pay

## 2018-01-29 ENCOUNTER — Emergency Department (HOSPITAL_BASED_OUTPATIENT_CLINIC_OR_DEPARTMENT_OTHER)
Admission: EM | Admit: 2018-01-29 | Discharge: 2018-01-29 | Disposition: A | Discharge status: Home / Self Care | Payer: BLUE CROSS/BLUE SHIELD | Attending: Emergency Medicine | Admitting: Emergency Medicine

## 2018-01-29 ENCOUNTER — Encounter (HOSPITAL_BASED_OUTPATIENT_CLINIC_OR_DEPARTMENT_OTHER): Payer: Self-pay

## 2018-01-29 ENCOUNTER — Emergency Department (HOSPITAL_BASED_OUTPATIENT_CLINIC_OR_DEPARTMENT_OTHER): Payer: BLUE CROSS/BLUE SHIELD

## 2018-01-29 DIAGNOSIS — M25561 Pain in right knee: Secondary | ICD-10-CM | POA: Insufficient documentation

## 2018-01-29 DIAGNOSIS — Z79899 Other long term (current) drug therapy: Secondary | ICD-10-CM | POA: Diagnosis not present

## 2018-01-29 DIAGNOSIS — S8991XA Unspecified injury of right lower leg, initial encounter: Secondary | ICD-10-CM | POA: Diagnosis not present

## 2018-01-29 MED ORDER — DIAZEPAM 2 MG PO TABS
2.0000 mg | ORAL_TABLET | Freq: Once | ORAL | Status: AC
  Administered 2018-01-29: 2 mg via ORAL
  Filled 2018-01-29: qty 1

## 2018-01-29 MED ORDER — OXYCODONE HCL 5 MG PO TABS
5.0000 mg | ORAL_TABLET | Freq: Once | ORAL | Status: AC
  Administered 2018-01-29: 5 mg via ORAL
  Filled 2018-01-29: qty 1

## 2018-01-29 MED ORDER — KETOROLAC TROMETHAMINE 15 MG/ML IJ SOLN
15.0000 mg | Freq: Once | INTRAMUSCULAR | Status: AC
  Administered 2018-01-29: 15 mg via INTRAMUSCULAR
  Filled 2018-01-29: qty 1

## 2018-01-29 MED ORDER — ACETAMINOPHEN 500 MG PO TABS
1000.0000 mg | ORAL_TABLET | Freq: Once | ORAL | Status: AC
  Administered 2018-01-29: 1000 mg via ORAL
  Filled 2018-01-29: qty 2

## 2018-01-29 NOTE — ED Triage Notes (Signed)
Pt states she fell on right knee 2 weeks ago and fell on knee x 2 today-states she was seen by ortho-had fluid drained and was advised to return prn-wearing wearing knee sleeve-to triage in w/c

## 2018-01-29 NOTE — ED Provider Notes (Signed)
[facility] EMERGENCY DEPARTMENT Provider Note   CSN: [id number] Arrival date & time: 01/29/18  2109     History   Chief Complaint Chief Complaint  Patient presents with  . Knee Injury    HPI Jessica [name] is a 37 y.o. female.  37 yo F with a chief complaint of right knee pain.  This is been an ongoing issue for her.  She is recently been seen in the orthopedic office for this worsening and received a possible arthrocentesis though they were unable to drain any fluid at that time.  She denies any fevers or chills.  She is fallen on it a couple times today because it became so painful that she dropped to the ground.  Pain to the posterior and anterior aspect.  Extends down to the proximal tibia.   The history is provided by the patient and the spouse.  Knee Pain   This is a new problem. The current episode started 2 days ago. The problem occurs constantly. The problem has been gradually worsening. The pain is present in the right knee. The quality of the pain is described as aching. The pain is at a severity of 5/10. The pain is moderate. She has tried nothing for the symptoms. The treatment provided no relief. There has been a history of trauma.    Past Medical History:  Diagnosis Date  . Appendicitis   . 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 History of pyelonephritis     Patient Active Problem List   Diagnosis Date Noted  . Vaginitis 07/10/2011  . Delivery of twins, both live 05/28/2011  . Lactating mother 05/28/2011  . Status post tubal ligation at time of delivery, current hospitalization 05/28/2011  . Anemia 05/24/2011  . Abdominal wall pain in left paramedian 12/20/2010    Past Surgical History:  Procedure Laterality Date  . APPENDECTOMY  08/1999  . HERNIA REPAIR  08/2008   supraumbilical  . KNEE ARTHROSCOPY Right 03/23/2013   Procedure: ARTHROSCOPY RIGHT KNEE;  Surgeon: Harvie Junior, MD;  Location: [facility];  Service:  Orthopedics;  Laterality: Right;  Chondroplasty with fermoral lateral release and medial and lateral plica excision  . TUBAL LIGATION  05/26/2011   Procedure: POST PARTUM TUBAL LIGATION;  Surgeon: Esmeralda Arthur, MD;  Location: [facility];  Service: Gynecology;  Laterality: Bilateral;     OB History    Gravida  4   Para  2   Term  1   Preterm  1   AB  2   Living  3     SAB  1   TAB  1   Ectopic  0   Multiple  1   Live Births  3            Home Medications    Prior to Admission medications   Medication Sig Start Date End Date Taking? Authorizing Provider  ibuprofen (ADVIL,MOTRIN) 200 MG tablet Take 600 mg by mouth every 6 (six) hours as needed for moderate pain.    [provider]  linaclotide Karlene Einstein) 145 MCG CAPS capsule Take 1 capsule (145 mcg total) by mouth daily before breakfast. 06/26/16   Esterwood, Amy S, PA-C  methocarbamol (ROBAXIN) 500 MG tablet Take 1 tablet (500 mg total) by mouth 2 (two) times daily. 10/06/17   Rancour, Jeannett Senior, MD  naproxen (NAPROSYN) 500 MG tablet Take 1 tablet (500 mg total) by mouth 2 (two) times daily. 10/06/17   Rancour, Jeannett Senior,  MD  oxyCODONE-acetaminophen (PERCOCET/ROXICET) 5-325 MG per tablet Take 1-2 tablets by mouth every 6 (six) hours as needed for severe pain. 03/23/13   Marshia Ly, PA-C  polyethylene glycol [facility]) packet Take 17 g by mouth daily. 10/06/17   Rancour, Jeannett Senior, MD  traMADol (ULTRAM) 50 MG tablet Take 1 tablet (50 mg total) by mouth every 6 (six) hours as needed. 06/04/15   Marlon Pel, PA-C    Family History Family History  Problem Relation Age of Onset  . Hypertension Mother   . Alcohol abuse Father   . Birth defects Father        polydactyly  . Diabetes Maternal Aunt   . Birth defects Sister        polydactyly  . Stroke Maternal Aunt   . Kidney disease Maternal Aunt     Social History Social History   Tobacco Use  . Smoking status: Never Smoker  . Smokeless tobacco: Never Used    Substance Use Topics  . Alcohol use: No  . Drug use: No     Allergies   Hydrocodone-acetaminophen   Review of Systems Review of Systems  Constitutional: Negative for chills and fever.  HENT: Negative for congestion and rhinorrhea.   Eyes: Negative for redness and visual disturbance.  Respiratory: Negative for shortness of breath and wheezing.   Cardiovascular: Negative for chest pain and palpitations.  Gastrointestinal: Negative for nausea and vomiting.  Genitourinary: Negative for dysuria and urgency.  Musculoskeletal: Positive for arthralgias. Negative for myalgias.  Skin: Negative for pallor and wound.  Neurological: Negative for dizziness and headaches.     Physical Exam Updated Vital Signs BP 120/77 (BP Location: Right Arm)   Pulse 70   Temp 98.4 F (36.9 C) (Oral)   Resp 18   Ht 5\' 5"  (1.651 m)   Wt 73.9 kg   LMP 12/30/2017   SpO2 100%   BMI 27.12 kg/m   Physical Exam  Constitutional: She is oriented to person, place, and time. She appears well-developed and well-nourished. No distress.  HENT:  Head: Normocephalic and atraumatic.  Eyes: Pupils are equal, round, and reactive to light. EOM are normal.  Neck: Normal range of motion. Neck supple.  Cardiovascular: Normal rate and regular rhythm. Exam reveals no gallop and no friction rub.  No murmur heard. Pulmonary/Chest: Effort normal. She has no wheezes. She has no rales.  Abdominal: Soft. She exhibits no distension. There is no tenderness.  Musculoskeletal: She exhibits edema and tenderness.  Mildly swollen right knee.  There is posterior area of focal pain and fluctuance.  Range of motion with some tenderness but minimal.  Ligaments appear to be intact.  Pulse motor and sensation is intact distally.  Neurological: She is alert and oriented to person, place, and time.  Skin: Skin is warm and dry. She is not diaphoretic.  Psychiatric: She has a normal mood and affect. Her behavior is normal.  Nursing note  and vitals reviewed.    ED Treatments / Results  Labs (all labs ordered are listed, but only abnormal results are displayed) Labs Reviewed - No data to display  EKG None  Radiology Dg Knee Complete 4 Views Right  Result Date: 01/29/2018 CLINICAL DATA:  37 year old female with trauma to the right knee. EXAM: RIGHT KNEE - COMPLETE 4+ VIEW COMPARISON:  None. FINDINGS: No evidence of fracture, dislocation, or joint effusion. No evidence of arthropathy or other focal bone abnormality. Soft tissues are unremarkable. IMPRESSION: Negative. Electronically Signed   By: Burtis Junes  Radparvar M.D.   On: 01/29/2018 22:15    Procedures Procedures (including critical care time)  Medications Ordered in ED Medications  ketorolac (TORADOL) 15 MG/ML injection 15 mg (15 mg Intramuscular Given 01/29/18 2215)  acetaminophen (TYLENOL) tablet 1,000 mg (1,000 mg Oral Given 01/29/18 2214)  oxyCODONE (Oxy IR/ROXICODONE) immediate release tablet 5 mg (5 mg Oral Given 01/29/18 2214)  diazepam (VALIUM) tablet 2 mg (2 mg Oral Given 01/29/18 2214)     Initial Impression / Assessment and Plan / ED Course  I have reviewed the triage vital signs and the nursing notes.  Pertinent labs & imaging results that were available during my care of the patient were reviewed by me and considered in my medical decision making (see chart for details).     37 yo F with a chief complaint of right knee pain.  This been an ongoing issue for her.  She had a recent knee surgery and has had continued symptoms.  She recently had a attempted arthrocentesis last week with an inability to withdraw any fluid.  It was so painful today that she fell twice onto that knee.  Clinically the patient appears to have a Baker's cyst.  I doubt that this is septic arthritis.  She has just mild swelling and his minimal pain with range of motion.  Plain films viewed by me negative for fracture.  We will fully immobilize the knee.  Have her follow-up with  her orthopedist in the office.  10:20 PM:  I have discussed the diagnosis/risks/treatment options with the patient and family and believe the pt to be eligible for discharge home to follow-up with Ortho. We also discussed returning to the ED immediately if new or worsening sx occur. We discussed the sx which are most concerning (e.g., sudden worsening pain, fever, inability to tolerate by mouth) that necessitate immediate return. Medications administered to the patient during their visit and any new prescriptions provided to the patient are listed below.  Medications given during this visit Medications  ketorolac (TORADOL) 15 MG/ML injection 15 mg (15 mg Intramuscular Given 01/29/18 2215)  acetaminophen (TYLENOL) tablet 1,000 mg (1,000 mg Oral Given 01/29/18 2214)  oxyCODONE (Oxy IR/ROXICODONE) immediate release tablet 5 mg (5 mg Oral Given 01/29/18 2214)  diazepam (VALIUM) tablet 2 mg (2 mg Oral Given 01/29/18 2214)      The patient appears reasonably screen and/or stabilized for discharge and I doubt any other medical condition or other [facility] requiring further screening, evaluation, or treatment in the ED at this time prior to discharge.    Final Clinical Impressions(s) / ED Diagnoses   Final diagnoses:  Acute pain of right knee    ED Discharge Orders    None       Melene Plan, DO 01/29/18 2221

## 2018-01-29 NOTE — ED Notes (Signed)
Patient transported to X-ray 

## 2018-01-29 NOTE — Discharge Instructions (Addendum)
Return to the ED for worsening pain, fever.

## 2018-01-29 NOTE — ED Notes (Signed)
Pt understood dc material. NAD noted. 

## 2018-01-30 DIAGNOSIS — M25561 Pain in right knee: Secondary | ICD-10-CM | POA: Diagnosis not present

## 2018-02-11 DIAGNOSIS — M25561 Pain in right knee: Secondary | ICD-10-CM | POA: Diagnosis not present

## 2018-02-17 DIAGNOSIS — M25561 Pain in right knee: Secondary | ICD-10-CM | POA: Diagnosis not present

## 2018-03-08 DIAGNOSIS — M25561 Pain in right knee: Secondary | ICD-10-CM | POA: Diagnosis not present

## 2018-04-02 DIAGNOSIS — H40033 Anatomical narrow angle, bilateral: Secondary | ICD-10-CM | POA: Diagnosis not present

## 2018-04-02 DIAGNOSIS — H04123 Dry eye syndrome of bilateral lacrimal glands: Secondary | ICD-10-CM | POA: Diagnosis not present

## 2018-04-22 ENCOUNTER — Other Ambulatory Visit: Payer: Self-pay | Admitting: Orthopedic Surgery

## 2018-05-28 DIAGNOSIS — Z118 Encounter for screening for other infectious and parasitic diseases: Secondary | ICD-10-CM | POA: Diagnosis not present

## 2018-05-28 DIAGNOSIS — Z113 Encounter for screening for infections with a predominantly sexual mode of transmission: Secondary | ICD-10-CM | POA: Diagnosis not present

## 2018-05-28 DIAGNOSIS — Z6828 Body mass index (BMI) 28.0-28.9, adult: Secondary | ICD-10-CM | POA: Diagnosis not present

## 2018-05-28 DIAGNOSIS — Z1151 Encounter for screening for human papillomavirus (HPV): Secondary | ICD-10-CM | POA: Diagnosis not present

## 2018-05-28 DIAGNOSIS — N760 Acute vaginitis: Secondary | ICD-10-CM | POA: Diagnosis not present

## 2018-05-28 DIAGNOSIS — Z114 Encounter for screening for human immunodeficiency virus [HIV]: Secondary | ICD-10-CM | POA: Diagnosis not present

## 2018-05-28 DIAGNOSIS — Z01419 Encounter for gynecological examination (general) (routine) without abnormal findings: Secondary | ICD-10-CM | POA: Diagnosis not present

## 2018-05-28 DIAGNOSIS — Z1159 Encounter for screening for other viral diseases: Secondary | ICD-10-CM | POA: Diagnosis not present

## 2018-05-29 ENCOUNTER — Other Ambulatory Visit: Payer: Self-pay | Admitting: Orthopedic Surgery

## 2018-06-02 ENCOUNTER — Ambulatory Visit (HOSPITAL_BASED_OUTPATIENT_CLINIC_OR_DEPARTMENT_OTHER)
Admission: RE | Admit: 2018-06-02 | Payer: BLUE CROSS/BLUE SHIELD | Source: Home / Self Care | Admitting: Orthopedic Surgery

## 2018-06-02 ENCOUNTER — Encounter (HOSPITAL_BASED_OUTPATIENT_CLINIC_OR_DEPARTMENT_OTHER): Admission: RE | Payer: Self-pay | Source: Home / Self Care

## 2018-06-02 SURGERY — ARTHROSCOPY, KNEE
Anesthesia: General | Laterality: Right

## 2018-07-01 NOTE — Progress Notes (Signed)
Patient contacted for [facility] pre-op call. Patient states that she was contacted by surgeons office and was told her surgery is postponed because their office is only doing emergency surgeries and she would be contacted with a new surgery date at a later time

## 2018-07-21 NOTE — Progress Notes (Signed)
Spoke with Joni Reining at [facility] office, explained pt on schedule for April 27 th, pt stated that her surgery for date was canceled in mid to latter part of March due to Covid.

## 2018-07-28 ENCOUNTER — Encounter (HOSPITAL_BASED_OUTPATIENT_CLINIC_OR_DEPARTMENT_OTHER): Admission: RE | Payer: Self-pay | Source: Home / Self Care

## 2018-07-28 ENCOUNTER — Ambulatory Visit (HOSPITAL_BASED_OUTPATIENT_CLINIC_OR_DEPARTMENT_OTHER)
Admission: RE | Admit: 2018-07-28 | Payer: BLUE CROSS/BLUE SHIELD | Source: Home / Self Care | Admitting: Orthopedic Surgery

## 2018-07-28 SURGERY — ARTHROSCOPY, KNEE, WITH LATERAL RETINACULUM RELEASE
Anesthesia: General | Laterality: Right

## 2018-08-12 ENCOUNTER — Other Ambulatory Visit: Payer: Self-pay | Admitting: Orthopedic Surgery

## 2018-11-06 ENCOUNTER — Other Ambulatory Visit (HOSPITAL_COMMUNITY): Payer: BC Managed Care – PPO

## 2018-11-06 NOTE — Progress Notes (Signed)
Spoke with patient via telephone, patient stated she had a lot going on right now and wanted to reschedule her surgery. Discussed with patient she would need to call Dr. Berenice Primas office to cancel and/or reschedule her procedure. Patient verbalized understanding.

## 2018-11-10 ENCOUNTER — Encounter (HOSPITAL_BASED_OUTPATIENT_CLINIC_OR_DEPARTMENT_OTHER): Admission: RE | Payer: Self-pay | Source: Home / Self Care

## 2018-11-10 ENCOUNTER — Ambulatory Visit (HOSPITAL_BASED_OUTPATIENT_CLINIC_OR_DEPARTMENT_OTHER)
Admission: RE | Admit: 2018-11-10 | Payer: BC Managed Care – PPO | Source: Home / Self Care | Admitting: Orthopedic Surgery

## 2018-11-10 SURGERY — ARTHROSCOPY, KNEE
Anesthesia: General | Laterality: Right

## 2019-05-24 ENCOUNTER — Other Ambulatory Visit: Payer: Self-pay

## 2019-05-24 ENCOUNTER — Ambulatory Visit
Admission: EM | Admit: 2019-05-24 | Discharge: 2019-05-24 | Disposition: A | Discharge status: Home / Self Care | Payer: BC Managed Care – PPO | Attending: Physician Assistant | Admitting: Physician Assistant

## 2019-05-24 ENCOUNTER — Encounter: Payer: Self-pay | Admitting: Emergency Medicine

## 2019-05-24 DIAGNOSIS — M778 Other enthesopathies, not elsewhere classified: Secondary | ICD-10-CM

## 2019-05-24 MED ORDER — DICLOFENAC SODIUM 1 % EX GEL: 2.0000 g | Freq: Four times a day (QID) | CUTANEOUS | 0 refills | Status: DC

## 2019-05-24 MED ORDER — DICLOFENAC SODIUM 50 MG PO TBEC: 50.0000 mg | DELAYED_RELEASE_TABLET | Freq: Two times a day (BID) | ORAL | 0 refills | Status: DC

## 2019-05-24 NOTE — Discharge Instructions (Signed)
Start diclofenac. Do not take ibuprofen (motrin/advil)/ naproxen (aleve) while on diclofenac. Can use gel form to rub onto the wrist for further relief. Ice compress, wrist splint during activity. Follow up with sports medicine for further evaluation if symptoms not improving.

## 2019-05-24 NOTE — ED Provider Notes (Signed)
[facility]    CSN: [id number] Arrival date & time: 05/24/19  3903      History   Chief Complaint Chief Complaint  Patient presents with  . Wrist Pain    HPI Jessica [name] is a 39 y.o. female.   39 year old female comes in for 3 day history right wrist pain.  Denies obvious injury/trauma.  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 No swelling, erythema, warmth. Pain can radiate up the arm, and down the 5th finger. Has had numbness/tingling the 5th finger. Pain present at rest, worse with movement. Ibuprofen 600mg  QHS without relief.     Past Medical History:  Diagnosis Date  . Appendicitis   . 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 History of pyelonephritis     Patient Active Problem List   Diagnosis Date Noted  . Vaginitis 07/10/2011  . Delivery of twins, both live 05/28/2011  . Lactating mother 05/28/2011  . Status post tubal ligation at time of delivery, current hospitalization 05/28/2011  . Anemia 05/24/2011  . Abdominal wall pain in left paramedian 12/20/2010    Past Surgical History:  Procedure Laterality Date  . APPENDECTOMY  08/1999  . HERNIA REPAIR  08/2008   supraumbilical  . KNEE ARTHROSCOPY Right 03/23/2013   Procedure: ARTHROSCOPY RIGHT KNEE;  Surgeon: 03/25/2013, MD;  Location: [facility];  Service: Orthopedics;  Laterality: Right;  Chondroplasty with fermoral lateral release and medial and lateral plica excision  . TUBAL LIGATION  05/26/2011   Procedure: POST PARTUM TUBAL LIGATION;  Surgeon: 05/28/2011, MD;  Location: [facility];  Service: Gynecology;  Laterality: Bilateral;    OB History    Gravida  4   Para  2   Term  1   Preterm  1   AB  2   Living  3     SAB  1   TAB  1   Ectopic  0   Multiple  1   Live Births  3            Home Medications    Prior to Admission medications   Medication Sig Start Date End Date  Taking? Authorizing Provider  diclofenac (VOLTAREN) 50 MG EC tablet Take 1 tablet (50 mg total) by mouth 2 (two) times daily. 05/24/19   05/26/19, Deanie Jupiter V, PA-C  diclofenac Sodium (VOLTAREN) 1 % GEL Apply 2 g topically 4 (four) times daily. 05/24/19   05/26/19, Sylver Vantassell V, PA-C  ibuprofen (ADVIL,MOTRIN) 200 MG tablet Take 600 mg by mouth every 6 (six) hours as needed for moderate pain.    [provider]  polyethylene glycol (MIRALAX) packet Take 17 g by mouth daily. 10/06/17   Rancour, 12/07/17, MD  linaclotide Jeannett Senior) 145 MCG CAPS capsule Take 1 capsule (145 mcg total) by mouth daily before breakfast. Patient not taking: Reported on 05/24/2019 06/26/16 05/24/19  05/26/19, PA-C    Family History Family History  Problem Relation Age of Onset  . Hypertension Mother   . Alcohol abuse Father   . Birth defects Father        polydactyly  . Diabetes Maternal Aunt   . Birth defects Sister        polydactyly  . Stroke Maternal Aunt   . Kidney disease Maternal Aunt     Social History Social History   Tobacco Use  . Smoking status: Never Smoker  . Smokeless tobacco: Never Used  Substance Use Topics  . Alcohol use: No  . Drug use: No     Allergies   Hydrocodone-acetaminophen   Review of Systems Review of Systems  Reason unable to perform ROS: See HPI as above.     Physical Exam Triage Vital Signs ED Triage Vitals  Enc Vitals Group     BP 05/24/19 1016 102/69     Pulse Rate 05/24/19 1016 91     Resp 05/24/19 1016 18     Temp 05/24/19 1016 98.2 F (36.8 C)     Temp Source 05/24/19 1016 Oral     SpO2 05/24/19 1016 97 %     Weight --      Height --      Head Circumference --      Peak Flow --      Pain Score 05/24/19 1017 8     Pain Loc --      Pain Edu? --      Excl. in [place]? --    No data found.  Updated Vital Signs BP 102/69 (BP Location: Left Arm)   Pulse 91   Temp 98.2 F (36.8 C) (Oral)   Resp 18   SpO2 97%   Physical Exam Constitutional:      General: She is  not in acute distress.    Appearance: Normal appearance. She is well-developed. She is not toxic-appearing or diaphoretic.  HENT:     Head: Normocephalic and atraumatic.  Eyes:     Conjunctiva/sclera: Conjunctivae normal.     Pupils: Pupils are equal, round, and reactive to light.  Pulmonary:     Effort: Pulmonary effort is normal. No respiratory distress.     Comments: Speaking in full sentences without difficulty Musculoskeletal:     Cervical back: Normal range of motion and neck supple.     Comments: No swelling, erythema, warmth, contusion.  No tenderness to palpation of the elbow, forearm.  Diffuse tenderness to palpation of the wrist, ulnar> radial.  No tenderness to palpation of the hand.  Full range of motion of the wrist, with pain increased during flexion, internal rotation, supination/pronation.  Strength 5 out of 5 bilaterally.  Sensation intact.  Radial pulse 2+, cap refill less than 2 seconds.  Skin:    General: Skin is warm and dry.  Neurological:     Mental Status: She is alert and oriented to person, place, and time.     UC Treatments / Results  Labs (all labs ordered are listed, but only abnormal results are displayed) Labs Reviewed - No data to display  EKG   Radiology No results found.  Procedures Procedures (including critical care time)  Medications Ordered in UC Medications - No data to display  Initial Impression / Assessment and Plan / UC Course  I have reviewed the triage vital signs and the nursing notes.  Pertinent labs & imaging results that were available during my care of the patient were reviewed by me and considered in my medical decision making (see chart for details).    Will treat tendinitis with diclofenac, Voltaren gel.  Ice compress, wrist pain during activity, rest.  Return precautions given.  Final Clinical Impressions(s) / UC Diagnoses   Final diagnoses:  Right wrist tendinitis   ED Prescriptions    Medication Sig Dispense  Auth. Provider   diclofenac Sodium (VOLTAREN) 1 % GEL Apply 2 g topically 4 (four) times daily. 50 g Peta Peachey V, PA-C   diclofenac (VOLTAREN) 50 MG EC tablet  Take 1 tablet (50 mg total) by mouth 2 (two) times daily. 20 tablet Belinda Fisher, PA-C     I have reviewed the PDMP during this encounter.   Belinda Fisher, PA-C 05/24/19 1657

## 2019-05-24 NOTE — ED Triage Notes (Signed)
Pt here for right wrist pain starting on Friday; pt denies obvious injury; pt wearing brace at present

## 2019-06-01 DIAGNOSIS — U071 COVID-19: Secondary | ICD-10-CM | POA: Diagnosis not present

## 2019-06-15 DIAGNOSIS — U071 COVID-19: Secondary | ICD-10-CM | POA: Diagnosis not present

## 2020-06-24 ENCOUNTER — Emergency Department
Admission: EM | Admit: 2020-06-24 | Discharge: 2020-06-24 | Disposition: A | Discharge status: Home / Self Care | Payer: 59 | Attending: Emergency Medicine | Admitting: Emergency Medicine

## 2020-06-24 ENCOUNTER — Other Ambulatory Visit: Payer: Self-pay

## 2020-06-24 DIAGNOSIS — R1030 Lower abdominal pain, unspecified: Secondary | ICD-10-CM | POA: Diagnosis present

## 2020-06-24 DIAGNOSIS — N3001 Acute cystitis with hematuria: Secondary | ICD-10-CM | POA: Insufficient documentation

## 2020-06-24 LAB — URINALYSIS, COMPLETE (UACMP) WITH MICROSCOPIC
Bacteria, UA: NONE SEEN
Bilirubin Urine: NEGATIVE
Glucose, UA: NEGATIVE mg/dL
Ketones, ur: NEGATIVE mg/dL
Nitrite: POSITIVE — AB
Protein, ur: NEGATIVE mg/dL
RBC / HPF: >50 RBC/hpf — ABNORMAL HIGH (ref 0–5)
Specific Gravity, Urine: 1.011 (ref 1.005–1.030)
pH: 6 (ref 5.0–8.0)

## 2020-06-24 LAB — POC URINE PREG, ED: Preg Test, Ur: NEGATIVE

## 2020-06-24 MED ORDER — IBUPROFEN 600 MG PO TABS
600.0000 mg | ORAL_TABLET | Freq: Once | ORAL | Status: AC
  Administered 2020-06-24: 600 mg via ORAL
  Filled 2020-06-24: qty 1

## 2020-06-24 MED ORDER — CEPHALEXIN 500 MG PO CAPS: 500.0000 mg | ORAL_CAPSULE | Freq: Two times a day (BID) | ORAL | 0 refills | Status: DC

## 2020-06-24 MED ORDER — BUTALBITAL-APAP-CAFFEINE 50-325-40 MG PO TABS
1.0000 | ORAL_TABLET | Freq: Once | ORAL | Status: AC
  Administered 2020-06-24: 1 via ORAL
  Filled 2020-06-24: qty 1

## 2020-06-24 MED ORDER — CEPHALEXIN 500 MG PO CAPS
500.0000 mg | ORAL_CAPSULE | Freq: Once | ORAL | Status: AC
  Administered 2020-06-24: 500 mg via ORAL
  Filled 2020-06-24: qty 1

## 2020-06-24 MED ORDER — CEPHALEXIN 500 MG PO CAPS: 500.0000 mg | ORAL_CAPSULE | Freq: Two times a day (BID) | ORAL | 0 refills | Status: AC

## 2020-06-24 NOTE — ED Provider Notes (Signed)
[facility] Emergency Department Provider Note  ____________________________________________  Time seen: Approximately 4:42 AM  I have reviewed the triage vital signs and the nursing notes.   HISTORY  Chief Complaint Headache and Abdominal Pain   HPI Jessica [name] is a 40 y.o. female with a history of prior UTIs who presents for evaluation headache, suprapubic abdominal pain, urinary frequency and dysuria.  Patient symptoms started 2 days ago.  She reports cramping mild suprapubic pain, urinary frequency, dysuria, and dark-colored urine.  Denies flank pain, fever, chills, body aches.  She reports that she has been having daily left-sided throbbing headaches.  She denies history of chronic headaches or migraines.  Denies any changes in vision, thunderclap headache, unilateral weakness or numbness, diplopia, dysarthria, dysphagia.  She has been taking ibuprofen at home for her symptoms.`   Past Medical History:  Diagnosis Date  . Appendicitis   . 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 History of pyelonephritis     Patient Active Problem List   Diagnosis Date Noted  . Vaginitis 07/10/2011  . Delivery of twins, both live 05/28/2011  . Lactating mother 05/28/2011  . Status post tubal ligation at time of delivery, current hospitalization 05/28/2011  . Anemia 05/24/2011  . Abdominal wall pain in left paramedian 12/20/2010    Past Surgical History:  Procedure Laterality Date  . APPENDECTOMY  08/1999  . HERNIA REPAIR  08/2008   supraumbilical  . KNEE ARTHROSCOPY Right 03/23/2013   Procedure: ARTHROSCOPY RIGHT KNEE;  Surgeon: Harvie Junior, MD;  Location: [facility];  Service: Orthopedics;  Laterality: Right;  Chondroplasty with fermoral lateral release and medial and lateral plica excision  . TUBAL LIGATION  05/26/2011   Procedure: POST PARTUM TUBAL LIGATION;  Surgeon: Esmeralda Arthur, MD;  Location: [facility];  Service: Gynecology;   Laterality: Bilateral;    Prior to Admission medications   Medication Sig Start Date End Date Taking? Authorizing Provider  cephALEXin (KEFLEX) 500 MG capsule Take 1 capsule (500 mg total) by mouth 2 (two) times daily for 7 days. 06/24/20 07/01/20  Nita Sickle, MD  diclofenac (VOLTAREN) 50 MG EC tablet Take 1 tablet (50 mg total) by mouth 2 (two) times daily. 05/24/19   Cathie Hoops, Amy V, PA-C  diclofenac Sodium (VOLTAREN) 1 % GEL Apply 2 g topically 4 (four) times daily. 05/24/19   Cathie Hoops, Amy V, PA-C  ibuprofen (ADVIL,MOTRIN) 200 MG tablet Take 600 mg by mouth every 6 (six) hours as needed for moderate pain.    [provider]  polyethylene glycol (MIRALAX) packet Take 17 g by mouth daily. 10/06/17   Rancour, Jeannett Senior, MD  linaclotide Karlene Einstein) 145 MCG CAPS capsule Take 1 capsule (145 mcg total) by mouth daily before breakfast. Patient not taking: Reported on 05/24/2019 06/26/16 05/24/19  Sammuel Cooper, PA-C    Allergies Hydrocodone-acetaminophen  Family History  Problem Relation Age of Onset  . Hypertension Mother   . Alcohol abuse Father   . Birth defects Father        polydactyly  . Diabetes Maternal Aunt   . Birth defects Sister        polydactyly  . Stroke Maternal Aunt   . Kidney disease Maternal Aunt     Social History Social History   Tobacco Use  . Smoking status: Never Smoker  . Smokeless tobacco: Never Used  Substance Use Topics  . Alcohol use: No  . Drug use: No    Review of Systems  Constitutional: Negative for fever. Eyes: Negative for visual changes. ENT: Negative for sore throat. Neck: No neck pain  Cardiovascular: Negative for chest pain. Respiratory: Negative for shortness of breath. Gastrointestinal: + suprapubic abdominal pain. no vomiting or diarrhea. Genitourinary: + dysuria, frequency Musculoskeletal: Negative for back pain. Skin: Negative for rash. Neurological: Negative for headaches, weakness or numbness. Psych: No SI or  HI  ____________________________________________   PHYSICAL EXAM:  VITAL SIGNS: ED Triage Vitals [06/24/20 0112]  Enc Vitals Group     BP 118/65     Pulse Rate 72     Resp 20     Temp 98.7 F (37.1 C)     Temp Source Oral     SpO2 100 %     Weight 165 lb (74.8 kg)     Height 5\' 4"  (1.626 m)     Head Circumference      Peak Flow      Pain Score 8     Pain Loc      Pain Edu?      Excl. in [place]?     Constitutional: Alert and oriented. Well appearing and in no apparent distress. HEENT:      Head: Normocephalic and atraumatic.         Eyes: Conjunctivae are normal. Sclera is non-icteric. EOMI, PERRL      Mouth/Throat: Mucous membranes are moist.       Neck: Supple with no signs of meningismus. Cardiovascular: Regular rate and rhythm. No murmurs, gallops, or rubs.  Respiratory: Normal respiratory effort. Lungs are clear to auscultation bilaterally.  Gastrointestinal: Soft, non tender, and non distended with positive bowel sounds. No rebound or guarding. Genitourinary: No CVA tenderness. Musculoskeletal:  No edema, cyanosis, or erythema of extremities. Neurologic: Normal speech and language. Face is symmetric. Moving all extremities. No gross focal neurologic deficits are appreciated. Skin: Skin is warm, dry and intact. No rash noted. Psychiatric: Mood and affect are normal. Speech and behavior are normal.  ____________________________________________   LABS (all labs ordered are listed, but only abnormal results are displayed)  Labs Reviewed  URINALYSIS, COMPLETE (UACMP) WITH MICROSCOPIC - Abnormal; Notable for the following components:      Result Value   Color, Urine AMBER (*)    APPearance CLEAR (*)    Hgb urine dipstick MODERATE (*)    Nitrite POSITIVE (*)    Leukocytes,Ua TRACE (*)    RBC / HPF >50 (*)    All other components within normal limits  URINE CULTURE  POC URINE PREG, ED   ____________________________________________  EKG  none   ____________________________________________  RADIOLOGY  none  ____________________________________________   PROCEDURES  Procedure(s) performed: None Procedures Critical Care performed:  None ____________________________________________   INITIAL IMPRESSION / ASSESSMENT AND PLAN / ED COURSE   40 y.o. female with a history of prior UTIs who presents for evaluation headache, suprapubic abdominal pain, urinary frequency and dysuria x 2 days.  Patient is well-appearing in no distress with normal vital signs, abdomen with no tenderness, no CVA tenderness, she is afebrile, neurologically intact, normal eye exam.  UA positive for urinary tract infection.  Review of prior medical records show the patient has grown Proteus mirabilis resistant to nitrofurantoin but susceptible to Keflex.  Patient was started on Keflex.  Given a dose of ibuprofen and Fioricet for headache with full resolution of her symptoms.  Remained extremely well-appearing with no concerning features of her headache.  Headache most likely caused by her urinary tract infection.  No  thunderclap headache, no neuro deficits, no fever, no meningeal signs, no altered mental status.  Patient received a dose of Keflex and will be discharged home with follow-up with PCP.  Recommended return to the emergency room for abdominal pain, flank pain or fever.  History gathered from patient and her significant other who was at bedside, plan discussed with both of them      _____________________________________________ Please note:  Patient was evaluated in Emergency Department today for the symptoms described in the history of present illness. Patient was evaluated in the context of the global COVID-19 pandemic, which necessitated consideration that the patient might be at risk for infection with the SARS-CoV-2 virus that causes COVID-19. Institutional protocols and algorithms that pertain to the evaluation of patients at risk for COVID-19 are in  a state of rapid change based on information released by regulatory bodies including the CDC and federal and state organizations. These policies and algorithms were followed during the patient's care in the ED.  Some ED evaluations and interventions may be delayed as a result of limited staffing during the pandemic.   [place] Controlled Substance Database was reviewed by me. ____________________________________________   FINAL CLINICAL IMPRESSION(S) / ED DIAGNOSES   Final diagnoses:  Acute cystitis with hematuria      NEW MEDICATIONS STARTED DURING THIS VISIT:  ED Discharge Orders         Ordered    cephALEXin (KEFLEX) 500 MG capsule  2 times daily,   Status:  Discontinued        06/24/20 0441    cephALEXin (KEFLEX) 500 MG capsule  2 times daily        06/24/20 0442           Note:  This document was prepared using Dragon voice recognition software and may include unintentional dictation errors.    Nita Sickle, MD 06/24/20 [phone]

## 2020-06-24 NOTE — ED Triage Notes (Signed)
Pt in with co headache x 2 days and also co suprapubic pain. States is having some urinary frequency. States urine is orange colored.

## 2020-06-26 LAB — URINE CULTURE: Culture: 80000 — AB

## 2021-07-05 ENCOUNTER — Encounter: Payer: Self-pay | Admitting: Emergency Medicine

## 2021-07-05 ENCOUNTER — Ambulatory Visit
Admission: EM | Admit: 2021-07-05 | Discharge: 2021-07-05 | Disposition: A | Discharge status: Home / Self Care | Payer: Managed Care, Other (non HMO) | Attending: Emergency Medicine | Admitting: Emergency Medicine

## 2021-07-05 DIAGNOSIS — N760 Acute vaginitis: Secondary | ICD-10-CM | POA: Diagnosis not present

## 2021-07-05 DIAGNOSIS — B9689 Other specified bacterial agents as the cause of diseases classified elsewhere: Secondary | ICD-10-CM | POA: Insufficient documentation

## 2021-07-05 LAB — URINALYSIS, ROUTINE W REFLEX MICROSCOPIC
Bilirubin Urine: NEGATIVE
Glucose, UA: NEGATIVE mg/dL
Hgb urine dipstick: NEGATIVE
Leukocytes,Ua: NEGATIVE
Nitrite: NEGATIVE
Protein, ur: 30 mg/dL — AB
Specific Gravity, Urine: >1.03 — ABNORMAL HIGH (ref 1.005–1.030)
pH: 6.5 (ref 5.0–8.0)

## 2021-07-05 LAB — URINALYSIS, MICROSCOPIC (REFLEX)
Bacteria, UA: NONE SEEN
RBC / HPF: NONE SEEN RBC/hpf (ref 0–5)

## 2021-07-05 LAB — WET PREP, GENITAL
Sperm: NONE SEEN
Trich, Wet Prep: NONE SEEN
WBC, Wet Prep HPF POC: >=10 — AB (ref <10)
Yeast Wet Prep HPF POC: NONE SEEN

## 2021-07-05 MED ORDER — METRONIDAZOLE 500 MG PO TABS: 500.0000 mg | ORAL_TABLET | Freq: Two times a day (BID) | ORAL | 0 refills | Status: AC

## 2021-07-05 NOTE — Discharge Instructions (Addendum)
Today you are being treated  for  Bacterial vaginosis  ? ?Wet prep positive for BV, negative for yeast and trichomoniasis ? ?Urinalysis negative for urinary infection ? ?Take Metronidazole 500 mg twice a day for 7 days, do not drink alcohol while using medication, this will make you feel sick  ? ?Bacterial vaginosis which results from an overgrowth of one on several organisms that are normally present in your vagina. Vaginosis is an inflammation of the vagina that can result in discharge, itching and pain. ? ?Labs pending 2-3 days, you will be contacted if positive for any sti and treatment will be sent to the pharmacy, you will have to return to the clinic if positive for gonorrhea to receive treatment  ? ?Please refrain from having sex until labs results, if positive please refrain from having sex until treatment complete and symptoms resolve  ? ?If positive for  Chlamydia  gonorrhea or trichomoniasis please notify partner or partners so they may tested as well ? ?Moving forward, it is recommended you use some form of protection against the transmission of sti infections  such as condoms or dental dams with each sexual encounter   ? ? ?In addition: ?Avoid baths, hot tubs and whirlpool spas.  ?Don't use scented or harsh soaps ?Avoid irritants. These include scented tampons and pads. ?Wipe from front to back after using the toilet. ?Don't douche. Your vagina doesn't require cleansing other than normal bathing.  ?Use a condom.  ?Wear cotton underwear, this fabric absorbs some moisture.  ? ? ?  ?

## 2021-07-05 NOTE — ED Provider Notes (Signed)
?[place] ? ? ? ?CSN: [id number] ?Arrival date & time: 07/05/21  [id number] ? ? ?  ? ?History   ?Chief Complaint ?Chief Complaint  ?Patient presents with  ? SEXUALLY TRANSMITTED DISEASE  ?  Just wants to rule out, no symptoms  ? ? ?HPI ?Jessica [name] is a 41 y.o. female.  ? ?Patient presents for routine STD testing, requesting urinalysis in addition.  Denies all symptoms.  Sexually active, 1 partner, no condom use.  No known exposures. ? ?Past Medical History:  ?Diagnosis Date  ? Appendicitis   ? 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 History of pyelonephritis   ? ? ?Patient Active Problem List  ? Diagnosis Date Noted  ? Vaginitis 07/10/2011  ? Delivery of twins, both live 05/28/2011  ? Lactating mother 05/28/2011  ? Status post tubal ligation at time of delivery, current hospitalization 05/28/2011  ? Anemia 05/24/2011  ? Abdominal wall pain in left paramedian 12/20/2010  ? ? ?Past Surgical History:  ?Procedure Laterality Date  ? APPENDECTOMY  08/1999  ? HERNIA REPAIR  [id number]  ? supraumbilical  ? KNEE ARTHROSCOPY Right 03/23/2013  ? Procedure: ARTHROSCOPY RIGHT KNEE;  Surgeon: Alta Corning, MD;  Location: [facility];  Service: Orthopedics;  Laterality: Right;  Chondroplasty with fermoral lateral release and medial and lateral plica excision  ? TUBAL LIGATION  05/26/2011  ? Procedure: POST PARTUM TUBAL LIGATION;  Surgeon: Alwyn Pea, MD;  Location: [facility];  Service: Gynecology;  Laterality: Bilateral;  ? ? ?OB History   ? ? Gravida  ?4  ? Para  ?2  ? Term  ?1  ? Preterm  ?1  ? AB  ?2  ? Living  ?3  ?  ? ? SAB  ?1  ? IAB  ?1  ? Ectopic  ?0  ? Multiple  ?1  ? Live Births  ?3  ?   ?  ?  ? ? ? ?Home Medications   ? ?Prior to Admission medications   ?Medication Sig Start Date End Date Taking? Authorizing Provider  ?Omega-3 Fatty Acids (FISH OIL) 1000 MG CAPS Take by mouth.   Yes [provider]  ?diclofenac (VOLTAREN) 50 MG EC tablet Take 1 tablet (50 mg total) by mouth 2 (two)  times daily. 05/24/19   Tasia Catchings, Amy V, PA-C  ?diclofenac Sodium (VOLTAREN) 1 % GEL Apply 2 g topically 4 (four) times daily. 05/24/19   Tasia Catchings, Amy V, PA-C  ?ibuprofen (ADVIL,MOTRIN) 200 MG tablet Take 600 mg by mouth every 6 (six) hours as needed for moderate pain.    [provider]  ?polyethylene glycol (MIRALAX) packet Take 17 g by mouth daily. 10/06/17   Rancour, Annie Main, MD  ?linaclotide Rolan Lipa) 145 MCG CAPS capsule Take 1 capsule (145 mcg total) by mouth daily before breakfast. ?Patient not taking: Reported on 05/24/2019 06/26/16 05/24/19  Alfredia Ferguson, PA-C  ? ? ?Family History ?Family History  ?Problem Relation Age of Onset  ? Hypertension Mother   ? Alcohol abuse Father   ? Birth defects Father   ?     polydactyly  ? Diabetes Maternal Aunt   ? Birth defects Sister   ?     polydactyly  ? Stroke Maternal Aunt   ? Kidney disease Maternal Aunt   ? ? ?Social History ?Social History  ? ?Tobacco Use  ? Smoking status: Never  ? Smokeless tobacco: Never  ?Substance Use Topics  ? Alcohol use: No  ? Drug  use: No  ? ? ? ?Allergies   ?Hydrocodone-acetaminophen ? ? ?Review of Systems ?Review of Systems  ?Constitutional: Negative.   ?Respiratory: Negative.    ?Genitourinary: Negative.   ?Musculoskeletal: Negative.   ?Skin: Negative.   ? ? ?Physical Exam ?Triage Vital Signs ?ED Triage Vitals [07/05/21 0923]  ?Enc Vitals Group  ?   BP 115/68  ?   Pulse Rate 97  ?   Resp 18  ?   Temp 97.8 ?F (36.6 ?C)  ?   Temp Source Temporal  ?   SpO2 100 %  ?   Weight   ?   Height   ?   Head Circumference   ?   Peak Flow   ?   Pain Score   ?   Pain Loc   ?   Pain Edu?   ?   Excl. in [place]?   ? ?No data found. ? ?Updated Vital Signs ?BP 115/68 (BP Location: Left Arm)   Pulse 97   Temp 97.8 ?F (36.6 ?C) (Temporal)   Resp 18   LMP 06/28/2021   SpO2 100%  ? ?Visual Acuity ?Right Eye Distance:   ?Left Eye Distance:   ?Bilateral Distance:   ? ?Right Eye Near:   ?Left Eye Near:    ?Bilateral Near:    ? ?Physical Exam ?Constitutional:   ?    Appearance: Normal appearance.  ?Eyes:  ?   Extraocular Movements: Extraocular movements intact.  ?Pulmonary:  ?   Effort: Pulmonary effort is normal.  ?Genitourinary: ?   Comments: Deferred, self collect vaginal swabs ?Skin: ?   General: Skin is warm and dry.  ?Neurological:  ?   Mental Status: She is alert and oriented to person, place, and time. Mental status is at baseline.  ?Psychiatric:     ?   Mood and Affect: Mood normal.     ?   Behavior: Behavior normal.  ? ? ? ?UC Treatments / Results  ?Labs ?(all labs ordered are listed, but only abnormal results are displayed) ?Labs Reviewed  ?WET PREP, GENITAL  ?URINALYSIS, ROUTINE W REFLEX MICROSCOPIC  ?CERVICOVAGINAL ANCILLARY ONLY  ? ? ?EKG ? ? ?Radiology ?No results found. ? ?Procedures ?Procedures (including critical care time) ? ?Medications Ordered in UC ?Medications - No data to display ? ?Initial Impression / Assessment and Plan / UC Course  ?I have reviewed the triage vital signs and the nursing notes. ? ?Pertinent labs & imaging results that were available during my care of the patient were reviewed by me and considered in my medical decision making (see chart for details). ? ?Bacterial vaginosis ? ?Urinalysis negative, wet prep positive for BV, negative for yeast and trichomoniasis, discussed findings with patient, [place] pending, will treat per protocol, metronidazole 7-day course prescribed, advised abstinence from alcohol while using medication, advised abstinence until all labs results, treatment is complete, advised condom use during all sexual encounters moving forward, may follow-up with urgent care as needed ?Final Clinical Impressions(s) / UC Diagnoses  ? ?Final diagnoses:  ?None  ? ?Discharge Instructions   ?None ?  ? ?ED Prescriptions   ?None ?  ? ?PDMP not reviewed this encounter. ?  ?Hans Eden, NP ?07/05/21 1039 ? ?

## 2021-07-05 NOTE — ED Triage Notes (Signed)
Pt requesting STD testing to rule out and would like a UA. Does not have any symptoms.  ?

## 2022-01-02 ENCOUNTER — Emergency Department
Admission: EM | Admit: 2022-01-02 | Discharge: 2022-01-02 | Disposition: A | Discharge status: Home / Self Care | Payer: Commercial Managed Care - HMO | Attending: Student in an Organized Health Care Education/Training Program | Admitting: Student in an Organized Health Care Education/Training Program

## 2022-01-02 ENCOUNTER — Emergency Department: Payer: Commercial Managed Care - HMO

## 2022-01-02 DIAGNOSIS — R197 Diarrhea, unspecified: Secondary | ICD-10-CM | POA: Insufficient documentation

## 2022-01-02 DIAGNOSIS — R112 Nausea with vomiting, unspecified: Secondary | ICD-10-CM | POA: Diagnosis not present

## 2022-01-02 DIAGNOSIS — Z20822 Contact with and (suspected) exposure to covid-19: Secondary | ICD-10-CM | POA: Diagnosis not present

## 2022-01-02 LAB — URINALYSIS, ROUTINE W REFLEX MICROSCOPIC
Bilirubin Urine: NEGATIVE
Glucose, UA: NEGATIVE mg/dL
Ketones, ur: NEGATIVE mg/dL
Nitrite: NEGATIVE
Protein, ur: 30 mg/dL — AB
RBC / HPF: >50 RBC/hpf — ABNORMAL HIGH (ref 0–5)
Specific Gravity, Urine: 1.009 (ref 1.005–1.030)
pH: 7 (ref 5.0–8.0)

## 2022-01-02 LAB — CBC
HCT: 33.7 % — ABNORMAL LOW (ref 36.0–46.0)
Hemoglobin: 10.6 g/dL — ABNORMAL LOW (ref 12.0–15.0)
MCH: 23.4 pg — ABNORMAL LOW (ref 26.0–34.0)
MCHC: 31.5 g/dL (ref 30.0–36.0)
MCV: 74.4 fL — ABNORMAL LOW (ref 80.0–100.0)
Platelets: 374 10*3/uL (ref 150–400)
RBC: 4.53 MIL/uL (ref 3.87–5.11)
RDW: 14.8 % (ref 11.5–15.5)
WBC: 7.7 10*3/uL (ref 4.0–10.5)
nRBC: 0 % (ref 0.0–0.2)

## 2022-01-02 LAB — COMPREHENSIVE METABOLIC PANEL
ALT: 19 U/L (ref 0–44)
AST: 26 U/L (ref 15–41)
Albumin: 3.8 g/dL (ref 3.5–5.0)
Alkaline Phosphatase: 42 U/L (ref 38–126)
Anion gap: 6 (ref 5–15)
BUN: <5 mg/dL — ABNORMAL LOW (ref 6–20)
CO2: 21 mmol/L — ABNORMAL LOW (ref 22–32)
Calcium: 8.9 mg/dL (ref 8.9–10.3)
Chloride: 113 mmol/L — ABNORMAL HIGH (ref 98–111)
Creatinine, Ser: 0.75 mg/dL (ref 0.44–1.00)
GFR, Estimated: >60 mL/min (ref >60)
Glucose, Bld: 98 mg/dL (ref 70–99)
Potassium: 3.6 mmol/L (ref 3.5–5.1)
Sodium: 140 mmol/L (ref 135–145)
Total Bilirubin: 0.6 mg/dL (ref 0.3–1.2)
Total Protein: 7.3 g/dL (ref 6.5–8.1)

## 2022-01-02 LAB — LIPASE, BLOOD: Lipase: 44 U/L (ref 11–51)

## 2022-01-02 LAB — SARS CORONAVIRUS 2 BY RT PCR: SARS Coronavirus 2 by RT PCR: NEGATIVE

## 2022-01-02 LAB — TROPONIN I (HIGH SENSITIVITY): Troponin I (High Sensitivity): <2 ng/L (ref <18)

## 2022-01-02 LAB — PREGNANCY, URINE: Preg Test, Ur: NEGATIVE

## 2022-01-02 MED ORDER — ONDANSETRON 4 MG PO TBDP: 4.0000 mg | ORAL_TABLET | Freq: Three times a day (TID) | ORAL | 0 refills | Status: AC | PRN

## 2022-01-02 MED ORDER — SODIUM CHLORIDE 0.9 % IV BOLUS
1000.0000 mL | Freq: Once | INTRAVENOUS | Status: AC
  Administered 2022-01-02: 1000 mL via INTRAVENOUS

## 2022-01-02 MED ORDER — IOHEXOL 300 MG/ML  SOLN
100.0000 mL | Freq: Once | INTRAMUSCULAR | Status: AC | PRN
  Administered 2022-01-02: 100 mL via INTRAVENOUS

## 2022-01-02 MED ORDER — ONDANSETRON HCL 4 MG/2ML IJ SOLN
4.0000 mg | Freq: Once | INTRAMUSCULAR | Status: AC
  Administered 2022-01-02: 4 mg via INTRAVENOUS
  Filled 2022-01-02: qty 2

## 2022-01-02 NOTE — ED Triage Notes (Signed)
Pt reports that she began having nausea, vomiting and diarrhea last Thursday. Denies pain, denies fever.

## 2022-01-02 NOTE — ED Provider Notes (Signed)
[facility] Provider Note    Event Date/Time   First MD Initiated Contact with Patient 01/02/22 1014     (approximate)   History   Vomiting and Diarrhea   HPI  Jessica [name] is a 41 y.o. femaleappendicitis and tubal ligation presents the ER for evaluation of nausea vomiting diarrhea since last Thursday.  States she is having 1 or 2 episodes per day.  Is not been having any bloody diarrhea no melena.  No recent antibiotics.  She denies any pain or discomfort.  Denies any chest pain or shortness of breath.     Physical Exam   Triage Vital Signs: ED Triage Vitals  Enc Vitals Group     BP 01/02/22 1005 111/65     Pulse Rate 01/02/22 1005 84     Resp 01/02/22 1005 16     Temp 01/02/22 1005 98.4 F (36.9 C)     Temp Source 01/02/22 1005 Oral     SpO2 01/02/22 1005 98 %     Weight 01/02/22 1006 165 lb (74.8 kg)     Height 01/02/22 1006 5\' 4"  (1.626 m)     Head Circumference --      Peak Flow --      Pain Score 01/02/22 1006 0     Pain Loc --      Pain Edu? --      Excl. in [place]? --     Most recent vital signs: Vitals:   01/02/22 1005  BP: 111/65  Pulse: 84  Resp: 16  Temp: 98.4 F (36.9 C)  SpO2: 98%     Constitutional: Alert  Eyes: Conjunctivae are normal.  Head: Atraumatic. Nose: No congestion/rhinnorhea. Mouth/Throat: Mucous membranes are moist.   Neck: Painless ROM.  Cardiovascular:   Good peripheral circulation. Respiratory: Normal respiratory effort.  No retractions.  Gastrointestinal: Soft and nontender in all four quadrants  Musculoskeletal:  no deformity Neurologic:  MAE spontaneously. No gross focal neurologic deficits are appreciated.  Skin:  Skin is warm, dry and intact. No rash noted. Psychiatric: Mood and affect are normal. Speech and behavior are normal.    ED Results / Procedures / Treatments   Labs (all labs ordered are listed, but only abnormal results are displayed) Labs Reviewed  COMPREHENSIVE  METABOLIC PANEL - Abnormal; Notable for the following components:      Result Value   Chloride 113 (*)    CO2 21 (*)    BUN <5 (*)    All other components within normal limits  CBC - Abnormal; Notable for the following components:   Hemoglobin 10.6 (*)    HCT 33.7 (*)    MCV 74.4 (*)    MCH 23.4 (*)    All other components within normal limits  URINALYSIS, ROUTINE W REFLEX MICROSCOPIC - Abnormal; Notable for the following components:   Color, Urine YELLOW (*)    APPearance HAZY (*)    Hgb urine dipstick LARGE (*)    Protein, ur 30 (*)    Leukocytes,Ua TRACE (*)    RBC / HPF >50 (*)    Bacteria, UA RARE (*)    All other components within normal limits  SARS CORONAVIRUS 2 BY RT PCR  LIPASE, BLOOD  PREGNANCY, URINE  POC URINE PREG, ED  TROPONIN I (HIGH SENSITIVITY)     EKG ED ECG REPORT I, Merlyn Lot, the attending physician, personally viewed and interpreted this ECG.   Date: 01/02/2022  EKG Time: 14:51  Rate:  60  Rhythm: sinus  Axis: normal  Intervals: normal qt  ST&T Change: no stemi, no depressions,      RADIOLOGY Please see ED Course for my review and interpretation.  I personally reviewed all radiographic images ordered to evaluate for the above acute complaints and reviewed radiology reports and findings.  These findings were personally discussed with the patient.  Please see medical record for radiology report.    PROCEDURES:  Critical Care performed:   Procedures   MEDICATIONS ORDERED IN ED: Medications  sodium chloride 0.9 % bolus 1,000 mL (1,000 mLs Intravenous New Bag/Given 01/02/22 1106)  ondansetron (ZOFRAN) injection 4 mg (4 mg Intravenous Given 01/02/22 1103)  iohexol (OMNIPAQUE) 300 MG/ML solution 100 mL (100 mLs Intravenous Contrast Given 01/02/22 1407)     IMPRESSION / MDM / ASSESSMENT AND PLAN / ED COURSE  I reviewed the triage vital signs and the nursing notes.                              Differential diagnosis includes, but  is not limited to, enteritis, gastritis, viral illness, foodborne illness, colitis, diverticulitis, dehydration, electrolyte abnormalityo  Patient presenting to the ER for evaluation of symptoms as described above.  Base on symptoms, risk factors and considered above differential, this presenting complaint could reflect a potentially life-threatening illness therefore the patient will be placed on continuous pulse oximetry and telemetry for monitoring.  Laboratory evaluation will be sent to evaluate for the above complaints.  We will give IV fluids as well as antiemetic.  Her abdominal exam is soft and benign.  Nonspecific enteritis but will observe and reassess after IV fluids and antiemetic to determine need for further imaging and work-up.    Clinical Course as of 01/02/22 1507  Tue Jan 02, 2022  1256 Patient complaining of increasing abdominal discomfort.  Will order CT imaging to further evaluate. [PR]  5284   Patient is agreeable to plan. [PR]  1506 Reassessed.  Her blood work is reassuring.  CT imaging is reassuring.  Repeat abdominal exam is soft benign.  EKG nonischemic.  Troponin negative.  At this point she stable appropriate for outpatient follow-up. [PR]    Clinical Course User Index [PR] Merlyn Lot, MD    FINAL CLINICAL IMPRESSION(S) / ED DIAGNOSES   Final diagnoses:  Nausea vomiting and diarrhea     Rx / DC Orders   ED Discharge Orders          Ordered    ondansetron (ZOFRAN-ODT) 4 MG disintegrating tablet  Every 8 hours PRN        01/02/22 1503             Note:  This document was prepared using Dragon voice recognition software and may include unintentional dictation errors.    Merlyn Lot, MD 01/02/22 [phone]

## 2022-04-16 ENCOUNTER — Emergency Department (HOSPITAL_COMMUNITY)
Admission: EM | Admit: 2022-04-16 | Discharge: 2022-04-16 | Disposition: A | Discharge status: Home / Self Care | Payer: Commercial Managed Care - HMO | Attending: Emergency Medicine | Admitting: Emergency Medicine

## 2022-04-16 ENCOUNTER — Other Ambulatory Visit: Payer: Self-pay

## 2022-04-16 ENCOUNTER — Emergency Department (HOSPITAL_COMMUNITY): Payer: Commercial Managed Care - HMO

## 2022-04-16 ENCOUNTER — Encounter (HOSPITAL_COMMUNITY): Payer: Self-pay

## 2022-04-16 DIAGNOSIS — M79602 Pain in left arm: Secondary | ICD-10-CM | POA: Diagnosis not present

## 2022-04-16 DIAGNOSIS — R55 Syncope and collapse: Secondary | ICD-10-CM

## 2022-04-16 DIAGNOSIS — R42 Dizziness and giddiness: Secondary | ICD-10-CM | POA: Insufficient documentation

## 2022-04-16 DIAGNOSIS — R110 Nausea: Secondary | ICD-10-CM | POA: Diagnosis not present

## 2022-04-16 LAB — CBC WITH DIFFERENTIAL/PLATELET
Abs Immature Granulocytes: 0.04 10*3/uL (ref 0.00–0.07)
Basophils Absolute: 0.1 10*3/uL (ref 0.0–0.1)
Basophils Relative: 0 %
Eosinophils Absolute: 0.1 10*3/uL (ref 0.0–0.5)
Eosinophils Relative: 1 %
HCT: 34.8 % — ABNORMAL LOW (ref 36.0–46.0)
Hemoglobin: 10.8 g/dL — ABNORMAL LOW (ref 12.0–15.0)
Immature Granulocytes: 0 %
Lymphocytes Relative: 12 %
Lymphs Abs: 1.4 10*3/uL (ref 0.7–4.0)
MCH: 23.4 pg — ABNORMAL LOW (ref 26.0–34.0)
MCHC: 31 g/dL (ref 30.0–36.0)
MCV: 75.3 fL — ABNORMAL LOW (ref 80.0–100.0)
Monocytes Absolute: 0.7 10*3/uL (ref 0.1–1.0)
Monocytes Relative: 6 %
Neutro Abs: 9.6 10*3/uL — ABNORMAL HIGH (ref 1.7–7.7)
Neutrophils Relative %: 81 %
Platelets: 411 10*3/uL — ABNORMAL HIGH (ref 150–400)
RBC: 4.62 MIL/uL (ref 3.87–5.11)
RDW: 15.7 % — ABNORMAL HIGH (ref 11.5–15.5)
WBC: 12 10*3/uL — ABNORMAL HIGH (ref 4.0–10.5)
nRBC: 0 % (ref 0.0–0.2)

## 2022-04-16 LAB — COMPREHENSIVE METABOLIC PANEL
ALT: 13 U/L (ref 0–44)
AST: 16 U/L (ref 15–41)
Albumin: 3.7 g/dL (ref 3.5–5.0)
Alkaline Phosphatase: 41 U/L (ref 38–126)
Anion gap: 6 (ref 5–15)
BUN: 7 mg/dL (ref 6–20)
CO2: 23 mmol/L (ref 22–32)
Calcium: 8.9 mg/dL (ref 8.9–10.3)
Chloride: 105 mmol/L (ref 98–111)
Creatinine, Ser: 0.85 mg/dL (ref 0.44–1.00)
GFR, Estimated: >60 mL/min (ref >60)
Glucose, Bld: 85 mg/dL (ref 70–99)
Potassium: 4 mmol/L (ref 3.5–5.1)
Sodium: 134 mmol/L — ABNORMAL LOW (ref 135–145)
Total Bilirubin: 0.6 mg/dL (ref 0.3–1.2)
Total Protein: 7.3 g/dL (ref 6.5–8.1)

## 2022-04-16 LAB — URINALYSIS, ROUTINE W REFLEX MICROSCOPIC
Bacteria, UA: NONE SEEN
Bilirubin Urine: NEGATIVE
Glucose, UA: NEGATIVE mg/dL
Hgb urine dipstick: NEGATIVE
Ketones, ur: NEGATIVE mg/dL
Leukocytes,Ua: NEGATIVE
Nitrite: NEGATIVE
Protein, ur: 30 mg/dL — AB
Specific Gravity, Urine: 1.015 (ref 1.005–1.030)
pH: 6 (ref 5.0–8.0)

## 2022-04-16 LAB — I-STAT BETA HCG BLOOD, ED (MC, WL, AP ONLY): I-stat hCG, quantitative: <5 m[IU]/mL (ref <5)

## 2022-04-16 LAB — TROPONIN I (HIGH SENSITIVITY)
Troponin I (High Sensitivity): <2 ng/L (ref <18)
Troponin I (High Sensitivity): <2 ng/L (ref <18)

## 2022-04-16 MED ORDER — LACTATED RINGERS IV BOLUS
1000.0000 mL | Freq: Once | INTRAVENOUS | Status: AC
  Administered 2022-04-16: 1000 mL via INTRAVENOUS

## 2022-04-16 NOTE — ED Notes (Signed)
Pt ambulated to the bathroom. Gave pt warm blanket.

## 2022-04-16 NOTE — ED Triage Notes (Signed)
Pt BIB [facility] from home initially c/o arm pain went to use the bathroom and had a near syncopal episode. Pt was orthostatic with EMS.

## 2022-04-16 NOTE — ED Provider Notes (Signed)
[facility] EMERGENCY DEPARTMENT Provider Note   CSN: [id number] Arrival date & time: 04/16/22  0746     History  Chief Complaint  Patient presents with   Near Syncope    Jessica [name] is a 42 y.o. female.  HPI     Presents with dizziness Began this AM when woke up Felt very hot, got some cold rags Felt nauseas, left arm pain. Left arm pain went away now.  EMS thought was dizzy maybe from low blood pressure Not feeling dizzy at this moment Yesterday afternoon did feel sort of shaky when rolling trash can but kept going Hx of heavy menses Was nauseas this AM, no vomiting, normally when feel hot like this will throw up but this was different than past episodes No vomiting, diarrhea, black or bloody stools No fevers, cough, chest pain, dyspnea, runny nose, sore throat, abdominal pain Near-syncope, nauseas, faint, felt hot. 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 Authorizing Provider  diclofenac Sodium (VOLTAREN) 1 % GEL Apply 2 g topically 4 (four) times daily. 05/24/19   Tasia Catchings, Amy V, PA-C  metroNIDAZOLE (FLAGYL) 500 MG tablet Take 1 tablet (500 mg total) by mouth 2 (two) times daily. 07/05/21   White, Leitha Schuller, NP  Omega-3 Fatty Acids (FISH OIL) 1000 MG CAPS Take by mouth.    [provider]  ondansetron (ZOFRAN-ODT) 4 MG disintegrating tablet Take 1 tablet (4 mg total) by mouth every 8 (eight) hours as needed for nausea or vomiting. 01/02/22   Merlyn Lot, MD  linaclotide Rolan Lipa) 145 MCG CAPS capsule Take 1 capsule (145 mcg total) by mouth daily before breakfast. Patient not taking: Reported on 05/24/2019 06/26/16 05/24/19   Alfredia Ferguson, PA-C      Allergies    Hydrocodone-acetaminophen    Review of Systems   Review of Systems  Physical Exam Updated Vital Signs BP 100/65   Pulse 81   Temp 98.5 F (36.9 C)   Resp 19   LMP 03/26/2022 (Approximate)   SpO2 100%  Physical Exam Vitals and nursing note reviewed.  Constitutional:      General: She is not in acute distress.    Appearance: Normal appearance. She is well-developed. She is not ill-appearing or diaphoretic.  HENT:     Head: Normocephalic and atraumatic.  Eyes:     General: No visual field deficit.    Extraocular Movements: Extraocular movements intact.     Conjunctiva/sclera: Conjunctivae normal.     Pupils: Pupils are equal, round, and reactive to light.  Cardiovascular:     Rate and Rhythm: Normal rate and regular rhythm.     Pulses: Normal pulses.     Heart sounds: Normal heart sounds. No murmur heard.    No friction rub. No gallop.  Pulmonary:     Effort: Pulmonary effort is normal. No respiratory distress.     Breath sounds: Normal breath sounds. No wheezing or rales.  Abdominal:     General: There is no distension.     Palpations: Abdomen is soft.     Tenderness: There is no abdominal tenderness. There is no guarding.  Musculoskeletal:  General: No swelling or tenderness.     Cervical back: Normal range of motion.  Skin:    General: Skin is warm and dry.     Findings: No erythema or rash.  Neurological:     General: No focal deficit present.     Mental Status: She is alert and oriented to person, place, and time.     GCS: GCS eye subscore is 4. GCS verbal subscore is 5. GCS motor subscore is 6.     Cranial Nerves: No cranial nerve deficit, dysarthria or facial asymmetry.     Sensory: No sensory deficit.     Motor: No weakness or tremor.     Coordination: Coordination normal. Finger-Nose-Finger Test normal.     Gait: Gait normal.     ED Results / Procedures / Treatments   Labs (all labs ordered are listed,  but only abnormal results are displayed) Labs Reviewed  CBC WITH DIFFERENTIAL/PLATELET - Abnormal; Notable for the following components:      Result Value   WBC 12.0 (*)    Hemoglobin 10.8 (*)    HCT 34.8 (*)    MCV 75.3 (*)    MCH 23.4 (*)    RDW 15.7 (*)    Platelets 411 (*)    Neutro Abs 9.6 (*)    All other components within normal limits  COMPREHENSIVE METABOLIC PANEL - Abnormal; Notable for the following components:   Sodium 134 (*)    All other components within normal limits  URINALYSIS, ROUTINE W REFLEX MICROSCOPIC - Abnormal; Notable for the following components:   APPearance HAZY (*)    Protein, ur 30 (*)    All other components within normal limits  I-STAT BETA HCG BLOOD, ED ([facility], [facility], [facility] ONLY)  TROPONIN I (HIGH SENSITIVITY)  TROPONIN I (HIGH SENSITIVITY)    EKG EKG Interpretation  Date/Time:  Monday April 16 2022 08:04:52 EST Ventricular Rate:  70 PR Interval:  140 QRS Duration: 88 QT Interval:  397 QTC Calculation: 429 R Axis:   68 Text Interpretation: Sinus rhythm Low voltage, precordial leads ST elev, probable normal early repol pattern No significant change since last tracing Confirmed by Gareth Morgan [phone]) on 04/16/2022 8:26:07 AM  Radiology DG Chest 2 View  Result Date: 04/16/2022 CLINICAL DATA:  arm pain went to use the bathroom and had a near syncopal episode. EXAM: CHEST - 2 VIEW COMPARISON:  10/18/2009 FINDINGS: Lungs are clear. Heart size and mediastinal contours are within normal limits. No effusion. Visualized bones unremarkable. IMPRESSION: No acute cardiopulmonary disease. Electronically Signed   By: Lucrezia Europe M.D.   On: 04/16/2022 09:23    Procedures Procedures    Medications Ordered in ED Medications  lactated ringers bolus 1,000 mL (0 mLs Intravenous Stopped 04/16/22 0946)    ED Course/ Medical Decision Making/ A&P                              42yo female who presents with concern for near-syncope.  DDx for syncope includes  cardiac arrhythmia, MI,  electrolyte abnormality, hypovolemia including dehydration and anemia/GI bleed, infection, vasovagal.   Describes more lightheadedness rather than vertigo, has no neurologic abnormalities on exam, doubt CVA.  No dyspnea, no hypoxia, no chest pain, doubt PE. Normal CXR< bilateral pulses, no cp and doubt aortic dissection.   Left arm pain with normal pulses, without swelling, normal strength, no sign of acute arterial thrombus, DVT, nor spinal emergency.  Labs completed and personally abided interpreted by me.  Urinalysis shows no signs of urinary tract infection.  Pregnancy test is negative.  CBC shows a hemoglobin of 10.8, mild leukocytosis of 12, no significant electrolyte abnormalities.  Troponin within normal limits, however given symptoms started prior to arrival, will repeat.  Chest x-ray shows no evidence of mediastinal widening, pneumothorax, enlarged heart, or pneumonia.  Repeat troponin WNL, no signs of ACS.  Given IV fluids. BP improved, no longer symptomatic, ambulatory with steady gait.    Possible dehydration, vasovagal episode. Recommend PCP follow up, consider outpatient monitoring. Patient discharged in stable condition with understanding of reasons to return.           Final Clinical Impression(s) / ED Diagnoses Final diagnoses:  Near syncope  Left arm pain    Rx / DC Orders ED Discharge Orders     None         Alvira Monday, MD 04/16/22 2216

## 2022-04-16 NOTE — ED Notes (Signed)
Patient ambulated to the bathroom, denies dizziness.

## 2023-10-19 ENCOUNTER — Other Ambulatory Visit: Payer: Self-pay

## 2023-10-19 ENCOUNTER — Emergency Department (HOSPITAL_BASED_OUTPATIENT_CLINIC_OR_DEPARTMENT_OTHER)

## 2023-10-19 ENCOUNTER — Encounter (HOSPITAL_BASED_OUTPATIENT_CLINIC_OR_DEPARTMENT_OTHER): Payer: Self-pay | Admitting: Emergency Medicine

## 2023-10-19 ENCOUNTER — Emergency Department (HOSPITAL_BASED_OUTPATIENT_CLINIC_OR_DEPARTMENT_OTHER)
Admission: EM | Admit: 2023-10-19 | Discharge: 2023-10-19 | Disposition: A | Discharge status: Home / Self Care | Attending: Emergency Medicine | Admitting: Emergency Medicine

## 2023-10-19 DIAGNOSIS — M79602 Pain in left arm: Secondary | ICD-10-CM | POA: Insufficient documentation

## 2023-10-19 LAB — COMPREHENSIVE METABOLIC PANEL WITH GFR
ALT: 9 U/L (ref 0–44)
AST: 17 U/L (ref 15–41)
Albumin: 4.1 g/dL (ref 3.5–5.0)
Alkaline Phosphatase: 48 U/L (ref 38–126)
Anion gap: 10 (ref 5–15)
BUN: 9 mg/dL (ref 6–20)
CO2: 24 mmol/L (ref 22–32)
Calcium: 8.7 mg/dL — ABNORMAL LOW (ref 8.9–10.3)
Chloride: 104 mmol/L (ref 98–111)
Creatinine, Ser: 0.84 mg/dL (ref 0.44–1.00)
GFR, Estimated: >60 mL/min (ref >60)
Glucose, Bld: 88 mg/dL (ref 70–99)
Potassium: 3.6 mmol/L (ref 3.5–5.1)
Sodium: 139 mmol/L (ref 135–145)
Total Bilirubin: 0.4 mg/dL (ref 0.0–1.2)
Total Protein: 6.9 g/dL (ref 6.5–8.1)

## 2023-10-19 LAB — CBC WITH DIFFERENTIAL/PLATELET
Abs Immature Granulocytes: 0.03 K/uL (ref 0.00–0.07)
Basophils Absolute: 0 K/uL (ref 0.0–0.1)
Basophils Relative: 0 %
Eosinophils Absolute: 0.1 K/uL (ref 0.0–0.5)
Eosinophils Relative: 1 %
HCT: 32.6 % — ABNORMAL LOW (ref 36.0–46.0)
Hemoglobin: 10.4 g/dL — ABNORMAL LOW (ref 12.0–15.0)
Immature Granulocytes: 0 %
Lymphocytes Relative: 23 %
Lymphs Abs: 2 K/uL (ref 0.7–4.0)
MCH: 23.7 pg — ABNORMAL LOW (ref 26.0–34.0)
MCHC: 31.9 g/dL (ref 30.0–36.0)
MCV: 74.3 fL — ABNORMAL LOW (ref 80.0–100.0)
Monocytes Absolute: 0.5 K/uL (ref 0.1–1.0)
Monocytes Relative: 6 %
Neutro Abs: 6 K/uL (ref 1.7–7.7)
Neutrophils Relative %: 70 %
Platelets: 393 K/uL (ref 150–400)
RBC: 4.39 MIL/uL (ref 3.87–5.11)
RDW: 15.3 % (ref 11.5–15.5)
WBC: 8.7 K/uL (ref 4.0–10.5)
nRBC: 0 % (ref 0.0–0.2)

## 2023-10-19 LAB — TROPONIN T, HIGH SENSITIVITY: Troponin T High Sensitivity: <15 ng/L (ref <19)

## 2023-10-19 MED ORDER — DIAZEPAM 5 MG PO TABS
5.0000 mg | ORAL_TABLET | Freq: Once | ORAL | Status: AC
  Administered 2023-10-19: 5 mg via ORAL
  Filled 2023-10-19: qty 1

## 2023-10-19 MED ORDER — METHOCARBAMOL 500 MG PO TABS: 500.0000 mg | ORAL_TABLET | Freq: Two times a day (BID) | ORAL | 0 refills | Status: AC

## 2023-10-19 MED ORDER — NAPROXEN 500 MG PO TABS: 500.0000 mg | ORAL_TABLET | Freq: Two times a day (BID) | ORAL | 0 refills | Status: DC

## 2023-10-19 MED ORDER — NAPROXEN 500 MG PO TABS: 500.0000 mg | ORAL_TABLET | Freq: Two times a day (BID) | ORAL | 0 refills | Status: AC

## 2023-10-19 MED ORDER — LIDOCAINE 5 % EX PTCH
1.0000 | MEDICATED_PATCH | CUTANEOUS | Status: DC
  Administered 2023-10-19: 1 via TRANSDERMAL
  Filled 2023-10-19: qty 1

## 2023-10-19 MED ORDER — METHOCARBAMOL 500 MG PO TABS: 500.0000 mg | ORAL_TABLET | Freq: Two times a day (BID) | ORAL | 0 refills | Status: DC

## 2023-10-19 NOTE — ED Triage Notes (Signed)
 Pt c/o LUE pain since Wed; no injury; pain from shoulder to hand

## 2023-10-19 NOTE — Discharge Instructions (Addendum)
 You were given a prescription for muscle relaxers, please take 1 tablet twice a day for the next 7 days.  In addition, you were given a naproxen  prescription, please take 1 tablet 2 times a day for the next 7 days with food.  In addition, you may apply ice or heat to the area to help with your symptoms.

## 2023-10-19 NOTE — ED Provider Notes (Signed)
 [facility] Provider Note   CSN: [id number] Arrival date & time: 10/19/23  1512     Patient presents with: Arm Pain   Jessica [name] is a 43 y.o. female.   43 year old female with no past medical history presents to the ED with a chief complaint of left upper extremity pain along with chest pain which has been ongoing for the past 3 days.  Patient describes a sharp stabbing pain that begins on the left side of her neck radiates down through her left shoulder, and down to her left hand.  She has tried some heating pads, Tylenol , over-the-counter medication without any improvement in her symptoms.  In addition, she is also endorsing some left-sided chest pressure, thinks that this was likely related to the left arm pain.  Has not had any improvement in her symptoms since.  There is no worsening pain with movement of the left shoulder.  She does not have any prior cardiac history, no family history of CAD, did have gestational diabetes but no longer a diabetic.  No fever, no vomiting, no shortness of breath.  The history is provided by the patient.  Arm Pain This is a new problem. The current episode started more than 2 days ago. The problem occurs constantly. The problem has not changed since onset.Associated symptoms include chest pain. Pertinent negatives include no abdominal pain, no headaches and no shortness of breath. Nothing aggravates the symptoms. Nothing relieves the symptoms.       Prior to Admission medications   Medication Sig Start Date End Date Taking? Authorizing Provider  methocarbamol  (ROBAXIN ) 500 MG tablet Take 1 tablet (500 mg total) by mouth 2 (two) times daily for 7 days. 10/19/23 10/26/23 Yes Mert Dietrick, PA-C  naproxen  (NAPROSYN ) 500 MG tablet Take 1 tablet (500 mg total) by mouth 2 (two) times daily for 7 days. 10/19/23 10/26/23 Yes Amaro Mangold, PA-C  metroNIDAZOLE  (FLAGYL ) 500 MG tablet Take 1 tablet (500 mg total) by  mouth 2 (two) times daily. 07/05/21   White, Shelba SAUNDERS, NP  Omega-3 Fatty Acids (FISH OIL) 1000 MG CAPS Take by mouth.    [provider]  ondansetron  (ZOFRAN -ODT) 4 MG disintegrating tablet Take 1 tablet (4 mg total) by mouth every 8 (eight) hours as needed for nausea or vomiting. 01/02/22   Lang Dover, MD  linaclotide  (LINZESS ) 145 MCG CAPS capsule Take 1 capsule (145 mcg total) by mouth daily before breakfast. Patient not taking: Reported on 05/24/2019 06/26/16 05/24/19  Esterwood, Amy S, PA-C    Allergies: Hydrocodone -acetaminophen     Review of Systems  Constitutional:  Negative for chills and fever.  Respiratory:  Negative for shortness of breath.   Cardiovascular:  Positive for chest pain.  Gastrointestinal:  Negative for abdominal pain, nausea and vomiting.  Musculoskeletal:  Positive for arthralgias. Negative for back pain.  Neurological:  Negative for headaches.  All other systems reviewed and are negative.   Updated Vital Signs BP 111/67   Pulse 86   Temp 98.1 F (36.7 C) (Oral)   Resp 18   Ht 5' 3 (1.6 m)   Wt 74.8 kg   SpO2 100%   BMI 29.23 kg/m   Physical Exam Vitals and nursing note reviewed.  Constitutional:      Appearance: Normal appearance. She is not ill-appearing.  HENT:     Head: Normocephalic and atraumatic.     Mouth/Throat:     Mouth: Mucous membranes are moist.  Cardiovascular:  Rate and Rhythm: Normal rate.  Pulmonary:     Effort: Pulmonary effort is normal.  Abdominal:     General: Abdomen is flat.  Musculoskeletal:        General: Tenderness present.       Arms:     Cervical back: Normal range of motion and neck supple.  Skin:    General: Skin is warm and dry.  Neurological:     Mental Status: She is alert and oriented to person, place, and time.     (all labs ordered are listed, but only abnormal results are displayed) Labs Reviewed  CBC WITH DIFFERENTIAL/PLATELET - Abnormal; Notable for the following components:       Result Value   Hemoglobin 10.4 (*)    HCT 32.6 (*)    MCV 74.3 (*)    MCH 23.7 (*)    All other components within normal limits  COMPREHENSIVE METABOLIC PANEL WITH GFR - Abnormal; Notable for the following components:   Calcium  8.7 (*)    All other components within normal limits  TROPONIN T, HIGH SENSITIVITY  TROPONIN T, HIGH SENSITIVITY    EKG: None  Radiology: DG Chest 2 View Result Date: 10/19/2023 CLINICAL DATA:  Chest pain. EXAM: CHEST - 2 VIEW COMPARISON:  04/16/2022 FINDINGS: The cardiomediastinal contours are normal. The lungs are clear. Pulmonary vasculature is normal. No consolidation, pleural effusion, or pneumothorax. No acute osseous abnormalities are seen. IMPRESSION: Negative radiographs of the chest. Electronically Signed   By: Andrea Gasman M.D.   On: 10/19/2023 18:34     Procedures   Medications Ordered in the ED  lidocaine  (LIDODERM ) 5 % 1 patch (1 patch Transdermal Patch Applied 10/19/23 1745)  diazepam  (VALIUM ) tablet 5 mg (5 mg Oral Given 10/19/23 1745)                                    Medical Decision Making Amount and/or Complexity of Data Reviewed Labs: ordered. Radiology: ordered.  Risk Prescription drug management.     This patient presents to the ED for concern of left arm pain, this involves a number of treatment options, and is a complaint that carries with it a high risk of complications and morbidity.  The differential diagnosis includes MSK, ACS, trauma.    Co morbidities: Discussed in HPI   Brief History:  See HPI.   EMR reviewed including pt PMHx, past surgical history and past visits to ER.   See HPI for more details   Lab Tests:  I ordered and independently interpreted labs.  The pertinent results include:    I personally reviewed all laboratory work and imaging. Metabolic panel without any acute abnormality specifically kidney function within normal limits and no significant electrolyte abnormalities. CBC  without leukocytosis or significant anemia.  Imaging Studies:  NAD. I personally reviewed all imaging studies and no acute abnormality found. I agree with radiology interpretation.  Cardiac Monitoring:  The patient was maintained on a cardiac monitor.  I personally viewed and interpreted the cardiac monitored which showed an underlying rhythm of: NSR EKG non-ischemic   Medicines ordered:  I ordered medication including Valium , Lidoderm  patch for symptomatic treatment Reevaluation of the patient after these medicines showed that the patient improved I have reviewed the patients home medicines and have made adjustments as needed  Reevaluation:  After the interventions noted above I re-evaluated patient and found that they have :improved  Social  Determinants of Health:  The patient's social determinants of health were a factor in the care of this patient  Problem List / ED Course:  Patient presented to the ED with a chief complaint of left arm pain which has been ongoing for the last 3 days, reports she has tried ice, heat, not sleeping on that side without much improvement in symptoms.  She reports that there is no pain with movement, however there is pain with palpation along the posterior left side of her neck radiating down her arm but no numbness or tingling.  No prior history of CAD, no prior history of diabetes or high blood pressure. On evaluation there is tenderness to palpation along the sternocleidomastoid, there is forward to motion of the left arm, no decrease in flexion or extension of the left shoulder.  Interpretation of her blood work by me reveals CBC with no leukocytosis, hemoglobin is within normal limits.  CMP with no Electra abnormality, LFTs are within normal limits. Consider ACS, due to left sided typical pain and that she tells me that it radiates onto her chest.  Troponin is negative, no prior history of CAD, EKG without any ST changes, no arrhythmias.  Low  suspicion for ACS. Chest x-ray without any signs of pneumonia, no pleural effusion noted.  She does not have any respiratory symptoms either. Given Valium  for antispasmodic, also lidocaine  patch to help with symptoms with improvement.  Vitals have remained stable.  There is no tachycardia, no hypoxia to suggest pulmonary embolism.  She does have worsening pain with palpation of the left shoulder.  We discussed likely MSK etiology, will go home on a short course of muscle relaxant along with anti-inflammatories.  Agreeable to plan and treatment, he stable for discharge.   Dispostion:  After consideration of the diagnostic results and the patients response to treatment, I feel that the patent would benefit from treatment with muscle relaxers, anti-inflammatories.  Strict return precautions.     Final diagnoses:  Left arm pain    ED Discharge Orders          Ordered    methocarbamol  (ROBAXIN ) 500 MG tablet  2 times daily        10/19/23 1836    naproxen  (NAPROSYN ) 500 MG tablet  2 times daily        10/19/23 1836               Omeka Holben, PA-C 10/19/23 1839    Horton, Kristie M, DO 10/19/23 1907

## 2024-03-27 ENCOUNTER — Other Ambulatory Visit: Payer: Self-pay

## 2024-03-27 ENCOUNTER — Emergency Department (HOSPITAL_BASED_OUTPATIENT_CLINIC_OR_DEPARTMENT_OTHER)

## 2024-03-27 DIAGNOSIS — S6991XA Unspecified injury of right wrist, hand and finger(s), initial encounter: Secondary | ICD-10-CM | POA: Diagnosis present

## 2024-03-27 DIAGNOSIS — S52134A Nondisplaced fracture of neck of right radius, initial encounter for closed fracture: Secondary | ICD-10-CM | POA: Insufficient documentation

## 2024-03-27 DIAGNOSIS — Y9351 Activity, roller skating (inline) and skateboarding: Secondary | ICD-10-CM | POA: Insufficient documentation

## 2024-03-27 MED ORDER — ACETAMINOPHEN 325 MG PO TABS
650.0000 mg | ORAL_TABLET | Freq: Once | ORAL | Status: AC
  Administered 2024-03-27: 650 mg via ORAL
  Filled 2024-03-27: qty 2

## 2024-03-27 NOTE — ED Triage Notes (Signed)
 Pt states that she was skating approx 30 mins ago and fell, hitting the back of her head and injuring right forearm. Pt denies LOC

## 2024-03-28 ENCOUNTER — Emergency Department (HOSPITAL_BASED_OUTPATIENT_CLINIC_OR_DEPARTMENT_OTHER)

## 2024-03-28 ENCOUNTER — Emergency Department (HOSPITAL_BASED_OUTPATIENT_CLINIC_OR_DEPARTMENT_OTHER)
Admission: EM | Admit: 2024-03-28 | Discharge: 2024-03-28 | Disposition: A | Discharge status: Home / Self Care | Attending: Emergency Medicine | Admitting: Emergency Medicine

## 2024-03-28 DIAGNOSIS — W19XXXA Unspecified fall, initial encounter: Secondary | ICD-10-CM

## 2024-03-28 DIAGNOSIS — S52134A Nondisplaced fracture of neck of right radius, initial encounter for closed fracture: Secondary | ICD-10-CM

## 2024-03-28 MED ORDER — KETOROLAC TROMETHAMINE 60 MG/2ML IM SOLN
30.0000 mg | Freq: Once | INTRAMUSCULAR | Status: AC
  Administered 2024-03-28: 30 mg via INTRAMUSCULAR
  Filled 2024-03-28: qty 2

## 2024-03-28 MED ORDER — KETOROLAC TROMETHAMINE 10 MG PO TABS: 10.0000 mg | ORAL_TABLET | Freq: Four times a day (QID) | ORAL | 0 refills | Status: AC | PRN

## 2024-03-28 MED ORDER — TRAMADOL HCL 50 MG PO TABS: 50.0000 mg | ORAL_TABLET | Freq: Four times a day (QID) | ORAL | 0 refills | Status: AC | PRN

## 2024-03-28 NOTE — ED Provider Notes (Signed)
 " [place] [facility]MENT AT [facility] Provider Note   CSN: [id number] Arrival date & time: 03/27/24  2027     Patient presents with: Fall   Jessica [name] is a 43 y.o. female.    Fall     43 year old female presenting to the emergency department after a ground-level mechanical fall after skating approximately 30 minutes ago.  The patient states that she slipped and struck the back of her head and landed on her right forearm.  She denies loss of consciousness.  She denies any other injuries or complaints.  She primarily is complaining of pain in her right forearm about the elbow.  She arrives GCS 15, ABC intact.  Prior to Admission medications  Medication Sig Start Date End Date Taking? Authorizing Provider  ketorolac  (TORADOL ) 10 MG tablet Take 1 tablet (10 mg total) by mouth every 6 (six) hours as needed. 03/28/24  Yes Jerrol Agent, MD  traMADol  (ULTRAM ) 50 MG tablet Take 1 tablet (50 mg total) by mouth every 6 (six) hours as needed. 03/28/24  Yes Jerrol Agent, MD  metroNIDAZOLE  (FLAGYL ) 500 MG tablet Take 1 tablet (500 mg total) by mouth 2 (two) times daily. 07/05/21   White, Shelba SAUNDERS, NP  Omega-3 Fatty Acids (FISH OIL) 1000 MG CAPS Take by mouth.    [provider]  ondansetron  (ZOFRAN -ODT) 4 MG disintegrating tablet Take 1 tablet (4 mg total) by mouth every 8 (eight) hours as needed for nausea or vomiting. 01/02/22   Lang Dover, MD  linaclotide  (LINZESS ) 145 MCG CAPS capsule Take 1 capsule (145 mcg total) by mouth daily before breakfast. Patient not taking: Reported on 05/24/2019 06/26/16 05/24/19  Esterwood, Amy S, PA-C    Allergies: Hydrocodone -acetaminophen     Review of Systems  All other systems reviewed and are negative.   Updated Vital Signs BP 109/64 (BP Location: Left Arm)   Pulse 82   Temp 98.1 F (36.7 C) (Oral)   Resp 16   LMP 03/12/2024   SpO2 98%   Physical Exam Vitals and nursing note reviewed.  Constitutional:       General: She is not in acute distress.    Appearance: She is well-developed.     Comments: GCS 15, ABC intact  HENT:     Head: Normocephalic and atraumatic.  Eyes:     Extraocular Movements: Extraocular movements intact.     Conjunctiva/sclera: Conjunctivae normal.     Pupils: Pupils are equal, round, and reactive to light.  Neck:     Comments: No midline tenderness to palpation of the cervical spine.  Range of motion intact Cardiovascular:     Rate and Rhythm: Normal rate and regular rhythm.     Heart sounds: No murmur heard. Pulmonary:     Effort: Pulmonary effort is normal. No respiratory distress.     Breath sounds: Normal breath sounds.  Chest:     Comments: Clavicles stable nontender to AP compression.  Chest wall stable and nontender to AP and lateral compression. Abdominal:     Palpations: Abdomen is soft.     Tenderness: There is no abdominal tenderness.     Comments: Pelvis stable to lateral compression  Musculoskeletal:     Cervical back: Neck supple.     Comments: No midline tenderness to palpation of the thoracic or lumbar spine.  Extremities atraumatic with intact range of motion with the exception of tenderness and swelling about the proximal right forearm, 2+ radial pulses, intact motor function about the median,  ulnar, radial nerve distributions.  Skin:    General: Skin is warm and dry.  Neurological:     Mental Status: She is alert.     Comments: Cranial nerves II through XII grossly intact.  Moving all 4 extremities spontaneously.  Sensation grossly intact all 4 extremities     (all labs ordered are listed, but only abnormal results are displayed) Labs Reviewed - No data to display  EKG: None  Radiology: DG Elbow Complete Right Result Date: 03/28/2024 EXAM: 3 VIEW(S) XRAY OF THE RIGHT ELBOW COMPARISON: Right forearm series 03/27/2024. CLINICAL HISTORY: 43 year old female status post Fall while skating. FINDINGS: BONES AND JOINTS: Acute nondisplaced  radial neck fracture. No malalignment. No convincing joint effusion. SOFT TISSUES: The soft tissues are unremarkable. IMPRESSION: 1. Acute nondisplaced right radial neck fracture. Electronically signed by: Helayne Hurst MD 03/28/2024 04:38 AM EST RP Workstation: [id number]   DG Forearm Right Result Date: 03/27/2024 EXAM: 2 VIEW(S) XRAY OF THE RIGHT FOREARM 03/27/2024 09:53:00 PM COMPARISON: None available. CLINICAL HISTORY: right forearm injury FINDINGS: FINDINGS: BONES AND JOINTS: No acute fracture. No malalignment. SOFT TISSUES: The soft tissues are unremarkable. IMPRESSION: 1. No acute fracture or dislocation. Electronically signed by: Dorethia Molt MD 03/27/2024 11:02 PM EST RP Workstation: [id number]   CT Head Wo Contrast Result Date: 03/27/2024 EXAM: CT HEAD WITHOUT CONTRAST 03/27/2024 10:08:02 PM TECHNIQUE: CT of the head was performed without the administration of intravenous contrast. Automated exposure control, iterative reconstruction, and/or weight based adjustment of the mA/kV was utilized to reduce the radiation dose to as low as reasonably achievable. COMPARISON: None available. CLINICAL HISTORY: Head trauma, GCS=15, no focal neuro findings (low risk) (Ped 0-17y) FINDINGS: BRAIN AND VENTRICLES: No acute hemorrhage. No evidence of acute infarct. No hydrocephalus. No extra-axial collection. No mass effect or midline shift. ORBITS: No acute abnormality. SINUSES: No acute abnormality. SOFT TISSUES AND SKULL: No acute soft tissue abnormality. No skull fracture. IMPRESSION: 1. No acute intracranial abnormality. Electronically signed by: Gilmore Molt 03/27/2024 10:47 PM EST RP Workstation: [id number]     Procedures   Medications Ordered in the ED  acetaminophen  (TYLENOL ) tablet 650 mg (650 mg Oral Given 03/27/24 2133)  ketorolac  (TORADOL ) injection 30 mg (30 mg Intramuscular Given 03/28/24 0409)                                    Medical Decision Making Amount and/or Complexity of  Data Reviewed Radiology: ordered.  Risk OTC drugs. Prescription drug management.   43 year old female presenting to the emergency department after a ground-level mechanical fall after skating approximately 30 minutes ago.  The patient states that she slipped and struck the back of her head and landed on her right forearm.  She denies loss of consciousness.  She denies any other injuries or complaints.  She primarily is complaining of pain in her right forearm about the elbow.  She arrives GCS 15, ABC intact.  On arrival, the patient was vitally stable.  Presenting with arm pain and headache after a fall.  CT head obtained in triage and was negative for acute intracranial abnormality.  An x-ray of the right forearm was also negative.  Given the patient swelling and tenderness, dedicated x-ray of the right elbow was obtained: IMPRESSION:  1. Acute nondisplaced right radial neck fracture.   The patient was placed in a sling for comfort. Outpatient referral to orthopedics was placed. Pt was advised, rest, ice, elevation  of the extremity, NSAIDs for pain control and opiates for breakthrough pain. Overall stable for discharge.      Final diagnoses:  Fall, initial encounter  Closed nondisplaced fracture of neck of right radius, initial encounter    ED Discharge Orders          Ordered    AMB referral to orthopedics        03/28/24 0504    ketorolac  (TORADOL ) 10 MG tablet  Every 6 hours PRN        03/28/24 0504    traMADol  (ULTRAM ) 50 MG tablet  Every 6 hours PRN        03/28/24 0504               Jerrol Agent, MD 03/28/24 1822  "

## 2024-03-28 NOTE — Discharge Instructions (Addendum)
 You sustained a fracture of the radial neck which is one of the bones in your forearm.  Recommend rest, ice, NSAIDs for pain control, opiates for breakthrough pain and a sling for comfort.  Your CT imaging was negative.  Follow-up outpatient with orthopedics.

## 2024-03-30 ENCOUNTER — Ambulatory Visit (INDEPENDENT_AMBULATORY_CARE_PROVIDER_SITE_OTHER)

## 2024-03-30 ENCOUNTER — Ambulatory Visit

## 2024-03-30 VITALS — BP 111/73 | HR 88 | Wt 168.6 lb

## 2024-03-30 DIAGNOSIS — S52134A Nondisplaced fracture of neck of right radius, initial encounter for closed fracture: Secondary | ICD-10-CM

## 2024-03-30 DIAGNOSIS — M25521 Pain in right elbow: Secondary | ICD-10-CM

## 2024-03-30 NOTE — Progress Notes (Signed)
 "  Office Visit Note   Patient: Jessica [name]           Date of Birth: 1981/01/28           MRN: [id number] Visit Date: 03/30/2024              Requested by: [facility] [address] [place],  [place] [address] PCP: [facility]   Assessment & Plan: Visit Diagnoses:  1. Closed nondisplaced fracture of neck of right radius, initial encounter   2. Pain in right elbow     Plan: Natural history and expected course discussed. Questions answered. Recommend rest and ice. Recommend keeping the elbow in a sling for 1 week and will return for follow up appointment. If fracture remains in excellent alignment will start therapy at that time for passive ROM exercises. Will follow up in 1 week.  Orders:  Orders Placed This Encounter  Procedures   DG Elbow Complete Right     Subjective: Chief Complaint: Right elbow pain  HPI Patient is a 43 y.o. year old female who presents with pain involving the right elbow. Onset of the symptoms was several days ago. Inciting event: injured during a fall. Current symptoms include pain and inability to use elbow directly after injury and . Pain is diffuse .  Patient has had no prior elbow problems. Treatment to date: Went to the ED after her fall and radiographs were taken showing a right radial neck fracture and was put in sling and told to see orthopaedics.  Objective: Vital Signs: BP 111/73 (BP Location: Left Arm, Patient Position: Sitting, Cuff Size: Normal)   Pulse 88   Wt 168 lb 9.6 oz (76.5 kg)   LMP 03/12/2024   SpO2 98%   BMI 29.87 kg/m   Physical Exam Gen: Alert, No Acute Distress right elbow: Skin intact, no erythema or induration noted. soft tissue tenderness and swelling at the lateral elbow, reduced range of motion of elbow, remainder of elbow exam is normal. Neurovascular intact distally  Imaging: Radiographs personally reviewed by me; reveal minimally displaced  fracture of the radial neck of the right elbow.   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   History of pyelonephritis     Family History  Problem Relation Age of Onset   Hypertension Mother    Alcohol abuse Father    Birth defects Father        polydactyly   Diabetes Maternal Aunt    Birth defects Sister        polydactyly   Stroke Maternal Aunt    Kidney disease Maternal Aunt     Past Surgical History:  Procedure Laterality Date   APPENDECTOMY  08/1999   HERNIA REPAIR  08/2008   supraumbilical   KNEE ARTHROSCOPY Right 03/23/2013   Procedure: ARTHROSCOPY RIGHT KNEE;  Surgeon: Norleen LITTIE Gavel, MD;  Location: [facility];  Service: Orthopedics;  Laterality: Right;  Chondroplasty with fermoral lateral release and medial and lateral plica excision   TUBAL LIGATION  05/26/2011   Procedure: POST PARTUM  TUBAL LIGATION;  Surgeon: Nena DELENA App, MD;  Location: [facility];  Service: Gynecology;  Laterality: Bilateral;   Social History   Occupational History   Not on file  Tobacco Use   Smoking status: Never   Smokeless tobacco: Never  Substance and Sexual Activity   Alcohol use: No   Drug use: No   Sexual activity: Yes    Birth control/protection: Surgical    Comment: last intercourse 2-3 wks ago   Current Outpatient Medications  Medication Instructions   ketorolac  (TORADOL ) 10 mg, Oral, Every 6 hours PRN   metroNIDAZOLE  (FLAGYL ) 500 mg, Oral, 2 times daily   Omega-3 Fatty Acids (FISH OIL) 1000 MG CAPS Take by mouth.   ondansetron  (ZOFRAN -ODT) 4 mg, Oral, Every 8 hours PRN   traMADol  (ULTRAM )  50 mg, Oral, Every 6 hours PRN   Allergies as of 03/30/2024   (No Active Allergies)   "

## 2024-03-30 NOTE — Patient Instructions (Signed)

## 2024-03-31 ENCOUNTER — Telehealth: Payer: Self-pay | Admitting: Physician Assistant

## 2024-03-31 ENCOUNTER — Telehealth (HOSPITAL_BASED_OUTPATIENT_CLINIC_OR_DEPARTMENT_OTHER): Payer: Self-pay

## 2024-03-31 ENCOUNTER — Telehealth: Payer: Self-pay

## 2024-03-31 NOTE — Telephone Encounter (Signed)
 Pt called saying that she needs a note to be out of work. Employers fax number is [phone]. Pt call back number is [phone]

## 2024-03-31 NOTE — Telephone Encounter (Signed)
 Pt called stating she need more detailed letter for employer. Pt states need return to work with restrictions and include no typing, and pt arm sling in sling. Please call pt about this matter at [phone].

## 2024-03-31 NOTE — Telephone Encounter (Signed)
 Patient needs a note with work restrictions and the date on the letter for work. Please contact patient [phone]

## 2024-04-06 ENCOUNTER — Ambulatory Visit (INDEPENDENT_AMBULATORY_CARE_PROVIDER_SITE_OTHER)

## 2024-04-06 VITALS — BP 105/69 | HR 82 | Ht 65.5 in | Wt 170.6 lb

## 2024-04-06 DIAGNOSIS — S52134D Nondisplaced fracture of neck of right radius, subsequent encounter for closed fracture with routine healing: Secondary | ICD-10-CM

## 2024-04-06 NOTE — Progress Notes (Signed)
 "  Office Visit Note   Patient: Jessica [name]           Date of Birth: Sep 15, 1980           MRN: [id number] Visit Date: 04/06/2024              Requested by: [facility] [address] [place],  [place] [address] PCP: [facility]   Assessment & Plan: Visit Diagnoses:  1. Closed nondisplaced fracture of neck of right radius with routine healing, subsequent encounter     Plan: Natural history and expected course discussed. Questions answered. Recommend sling removal and starting physical therapy for ROM exercises. Will follow up in 5 weeks for reevaluation.  Orders:  Orders Placed This Encounter  Procedures   DG Elbow Complete Right   Ambulatory referral to Physical Therapy     Subjective: Chief Complaint: Right elbow pain  HPI Patient is a 44 y.o. year old female who presents for a follow up appointment for the right elbow. Patient's symptoms are gradually improving since last visit. Pain is lateral. Treatment to date: sling.  Objective: Vital Signs: BP 105/69 (Cuff Size: Normal)   Pulse 82   Ht 5' 5.5 (1.664 m)   Wt 170 lb 9.6 oz (77.4 kg)   LMP 03/12/2024   BMI 27.96 kg/m   Physical Exam Gen: Alert, No Acute Distress right elbow: Skin intact, no erythema or induration noted. soft tissue tenderness and swelling at the radial neck, reduced range of motion of elbow; ROM -25-110, remainder of elbow exam is normal  Radiographs: X rays of right elbow reveals nondisplaced radial neck fracture, same alignment from previous radiographs  PMFS History: Patient Active Problem List   Diagnosis Date Noted   Vaginitis 07/10/2011   Delivery of twins, both live 05/28/2011   Lactating mother 05/28/2011   Status post tubal ligation at time of delivery, current hospitalization 05/28/2011   Anemia 05/24/2011   Abdominal wall pain in left paramedian 12/20/2010   Past Medical History:  Diagnosis Date    Appendicitis    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   History of pyelonephritis     Family History  Problem Relation Age of Onset   Hypertension Mother    Alcohol abuse Father    Birth defects Father        polydactyly   Diabetes Maternal Aunt    Birth defects Sister        polydactyly   Stroke Maternal Aunt    Kidney disease Maternal Aunt     Past Surgical History:  Procedure Laterality Date   APPENDECTOMY  08/1999   HERNIA REPAIR  08/2008   supraumbilical   KNEE ARTHROSCOPY Right 03/23/2013   Procedure: ARTHROSCOPY RIGHT KNEE;  Surgeon: Norleen LITTIE Gavel, MD;  Location: [facility];  Service: Orthopedics;  Laterality: Right;  Chondroplasty with fermoral lateral release and medial and lateral plica excision   TUBAL LIGATION  05/26/2011   Procedure: POST PARTUM TUBAL LIGATION;  Surgeon: Nena DELENA App, MD;  Location: [facility];  Service: Gynecology;  Laterality: Bilateral;   Social History   Occupational History   Not on file  Tobacco Use   Smoking status: Never   Smokeless tobacco: Never  Substance and Sexual Activity   Alcohol use: No   Drug use: No   Sexual activity: Yes    Birth control/protection: Surgical    Comment: last intercourse 2-3 wks ago   Current  Outpatient Medications  Medication Instructions   ketorolac  (TORADOL ) 10 mg, Oral, Every 6 hours PRN   metroNIDAZOLE  (FLAGYL ) 500 mg, Oral, 2 times daily   Omega-3 Fatty Acids (FISH OIL) 1000 MG CAPS Take by mouth.   ondansetron  (ZOFRAN -ODT) 4 mg, Oral, Every 8 hours PRN   traMADol  (ULTRAM ) 50 mg, Oral, Every 6 hours PRN   Allergies as of 04/06/2024   (No Active Allergies)   "

## 2024-04-16 ENCOUNTER — Telehealth: Payer: Self-pay

## 2024-04-16 NOTE — Telephone Encounter (Signed)
LVM for pt to cb to r/s appt

## 2024-05-08 ENCOUNTER — Ambulatory Visit

## 2024-05-08 VITALS — BP 102/66 | HR 79 | Ht 65.5 in | Wt 170.0 lb

## 2024-05-08 DIAGNOSIS — S52134D Nondisplaced fracture of neck of right radius, subsequent encounter for closed fracture with routine healing: Secondary | ICD-10-CM

## 2024-05-08 NOTE — Progress Notes (Signed)
 "  Office Visit Note   Patient: Jessica [name]           Date of Birth: 07/09/1980           MRN: [id number] Visit Date: 05/08/2024              Requested by: [facility] [address] [place],  [place] [address] PCP: [facility]   Assessment & Plan: Visit Diagnoses:  1. Closed nondisplaced fracture of neck of right radius with routine healing, subsequent encounter     Plan: Clinically patient healed, although fracture site with more lucency; recommending continuing NWB at this time and continuing ROM exercises, and will follow up in 2 weeks for repeat x rays.  Orders:  Orders Placed This Encounter  Procedures   DG Elbow Complete Right     Subjective: Chief Complaint: Right radial head fracture  HPI Patient is a 45 y.o. year old female who presents for a follow up appointment for the right elbow. Patient's symptoms are resolved since last visit. No longer is having pain but reports having some trouble snapping her fingers. No other complaints.  Objective: Vital Signs: BP 102/66   Pulse 79   Ht 5' 5.5 (1.664 m)   Wt 77.1 kg   BMI 27.86 kg/m   Physical Exam Gen: Alert, No Acute Distress right elbow: Skin intact, no erythema or induration noted. 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   History of pyelonephritis      Family History  Problem Relation Age of Onset   Hypertension Mother    Alcohol abuse Father    Birth defects Father        polydactyly   Diabetes Maternal Aunt    Birth defects Sister        polydactyly   Stroke Maternal Aunt    Kidney disease Maternal Aunt     Past Surgical History:  Procedure Laterality Date   APPENDECTOMY  08/1999   HERNIA REPAIR  08/2008   supraumbilical   KNEE ARTHROSCOPY Right 03/23/2013   Procedure: ARTHROSCOPY RIGHT KNEE;  Surgeon: Norleen LITTIE Gavel, MD;  Location: [facility];  Service: Orthopedics;  Laterality: Right;  Chondroplasty with fermoral lateral release and medial and lateral plica excision   TUBAL LIGATION  05/26/2011   Procedure: POST PARTUM TUBAL LIGATION;  Surgeon: Nena DELENA App, MD;  Location: [facility];  Service: Gynecology;  Laterality: Bilateral;   Social History   Occupational History   Not on file  Tobacco Use   Smoking status: Never   Smokeless tobacco: Never  Substance and Sexual Activity   Alcohol use: No   Drug use: No   Sexual activity: Yes    Birth control/protection: Surgical    Comment: last intercourse 2-3 wks ago   Current Outpatient Medications  Medication Instructions   ketorolac  (TORADOL ) 10  mg, Oral, Every 6 hours PRN   metroNIDAZOLE  (FLAGYL ) 500 mg, Oral, 2 times daily   Omega-3 Fatty Acids (FISH OIL) 1000 MG CAPS Take by mouth.   ondansetron  (ZOFRAN -ODT) 4 mg, Oral, Every 8 hours PRN   traMADol  (ULTRAM ) 50 mg, Oral, Every 6 hours PRN   Allergies as of 05/08/2024   (No Active Allergies)   "

## 2024-05-11 ENCOUNTER — Ambulatory Visit

## 2024-05-26 ENCOUNTER — Ambulatory Visit
